# Patient Record
Sex: Female | Born: 1980 | Race: White | Hispanic: No | Smoking: Former smoker
Health system: Southern US, Community
[De-identification: ages and names within clinical notes are randomized; demographics above are authoritative.]

## PROBLEM LIST (undated history)

## (undated) DIAGNOSIS — R51 Headache: Secondary | ICD-10-CM

## (undated) DIAGNOSIS — F329 Major depressive disorder, single episode, unspecified: Secondary | ICD-10-CM

## (undated) DIAGNOSIS — F1910 Other psychoactive substance abuse, uncomplicated: Secondary | ICD-10-CM

## (undated) DIAGNOSIS — R519 Headache, unspecified: Secondary | ICD-10-CM

## (undated) HISTORY — DX: Major depressive disorder, single episode, unspecified: F32.9

## (undated) HISTORY — DX: Headache, unspecified: R51.9

## (undated) HISTORY — DX: Other psychoactive substance abuse, uncomplicated: F19.10

## (undated) HISTORY — DX: Headache: R51

---

## 2010-12-01 HISTORY — PX: VAGINAL HYSTERECTOMY: SUR661

## 2018-06-23 ENCOUNTER — Ambulatory Visit: Payer: Self-pay | Admitting: Nurse Practitioner

## 2018-06-23 ENCOUNTER — Encounter: Payer: Self-pay | Admitting: Nurse Practitioner

## 2018-06-23 ENCOUNTER — Other Ambulatory Visit: Payer: Self-pay

## 2018-06-23 VITALS — BP 107/58 | HR 76 | Temp 98.5°F | Ht 60.0 in | Wt 136.8 lb

## 2018-06-23 DIAGNOSIS — Z7689 Persons encountering health services in other specified circumstances: Secondary | ICD-10-CM

## 2018-06-23 DIAGNOSIS — K5901 Slow transit constipation: Secondary | ICD-10-CM

## 2018-06-23 DIAGNOSIS — F324 Major depressive disorder, single episode, in partial remission: Secondary | ICD-10-CM

## 2018-06-23 DIAGNOSIS — K922 Gastrointestinal hemorrhage, unspecified: Secondary | ICD-10-CM

## 2018-06-23 LAB — CBC WITH DIFFERENTIAL/PLATELET
Basophils Absolute: 67 cells/uL (ref 0–200)
Basophils Relative: 0.8 %
Eosinophils Absolute: 1571 cells/uL — ABNORMAL HIGH (ref 15–500)
Eosinophils Relative: 18.7 %
HCT: 37 % (ref 35.0–45.0)
Hemoglobin: 12.5 g/dL (ref 11.7–15.5)
Lymphs Abs: 2318 cells/uL (ref 850–3900)
MCH: 31 pg (ref 27.0–33.0)
MCHC: 33.8 g/dL (ref 32.0–36.0)
MCV: 91.8 fL (ref 80.0–100.0)
MPV: 10.9 fL (ref 7.5–12.5)
Monocytes Relative: 6.3 %
Neutro Abs: 3914 cells/uL (ref 1500–7800)
Neutrophils Relative %: 46.6 %
Platelets: 334 10*3/uL (ref 140–400)
RBC: 4.03 10*6/uL (ref 3.80–5.10)
RDW: 11.8 % (ref 11.0–15.0)
Total Lymphocyte: 27.6 %
WBC mixed population: 529 cells/uL (ref 200–950)
WBC: 8.4 10*3/uL (ref 3.8–10.8)

## 2018-06-23 MED ORDER — BUPROPION HCL ER (XL) 150 MG PO TB24: 150.0000 mg | ORAL_TABLET | Freq: Every day | ORAL | 6 refills | Status: DC

## 2018-06-23 MED ORDER — FLUOXETINE HCL 20 MG PO TABS: 60.0000 mg | ORAL_TABLET | Freq: Every day | ORAL | 1 refills | Status: DC

## 2018-06-23 NOTE — Patient Instructions (Addendum)
Cheryl Smith,   Thank you for coming in to clinic today.  1. Referral to GI - They will call you to schedule.  2. For depression -  Continue fluoxetine 60 mg once daily - CHANGE wellbutrin to Wellbutrin XL 150 mg tablet once daily.  Please schedule a follow-up appointment with Cheryl Smith, AGNP. Return in about 6 weeks (around 08/04/2018) for depression AND 3 months for annual physical.  If you have any other questions or concerns, please feel free to call the clinic or send a message through MyChart. You may also schedule an earlier appointment if necessary.  You will receive a survey after today's visit either digitally by e-mail or paper by [employer] mail. Your experiences and feedback matter to us.  Please respond so we know how we are doing as we provide care for you.   Cheryl McardleLauren Turrell Severt, DNP, AGNP-BC Adult Gerontology Nurse Practitioner [facility], [facility]

## 2018-06-23 NOTE — Progress Notes (Signed)
Subjective:    Patient ID: Cheryl Smith, female    DOB: 1981/02/18, 37 y.o.   MRN: [id number]  Cheryl Smith is a 38 y.o. female presenting on 06/23/2018 for Establish Care (irregular bowel movement. Constipation, w/ bloody stool x 1 yr )   HPI Establish Care New Provider Pt last seen by PCP Cheryl Bushy, NP [facility] several months years ago.  Obtain records from [facility].  Constipation Patient presents today with CC of constipation with bloody stool over last 1 year.   - She only has BM 1-2 x per week.  Usually is only 1 x per week as each BM requires use of suppository (fleet brand). - Is taking no medication other than suppositories for BM.  Has tried fiber in past, Miralax daily for 1 month which works initially, but notes she builds a tolerance to its effects. Dulcolax - makes her "sick."   - Monthly, pt takes mag citrate.    - Has had ribbon-like bowel movements in past. - Has bloody bowel movement once per week.  1 week ago, blood filled the toilet.  This only occurred once and has not happened since.  She regularly has blood within her stool that is dark red.  Blood is more than just streaks on outside of stool.  Depression Takes bupropion 100 mg SR once daily and fluoxetine 60 mg once daily.  She still has occasional depression, but feels like her medications are doing well.  In past, patient has been hospitalized for depression with substance abuse.  She has history of opioid abuse that began after receiving opioids for hysterectomy procedure pain. She admits continued opoid use helped her manage depression before she started prescription medications to control depression.  No current SI/HI or plans to carry out.  Depression screen PHQ 2/9 06/23/2018  Decreased Interest 1  Down, Depressed, Hopeless 1  PHQ - 2 Score 2  Altered sleeping 1  Tired, decreased energy 1  Change in appetite 0  Feeling bad or failure about yourself  0  Trouble concentrating 0    Moving slowly or fidgety/restless 0  Suicidal thoughts 0  PHQ-9 Score 4  Difficult doing work/chores Somewhat difficult     Past Medical History:  Diagnosis Date  . Depression   . Frequent headaches   . Headache    2x week tension, monthly migraine  . Substance abuse (HCC)    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   Past Surgical History:  Procedure Laterality Date  . VAGINAL HYSTERECTOMY Left 2012   Rt ovary remains   Social History   Socioeconomic History  . Marital status: Significant Other    Spouse name: Not on file  . Number of children: 1  . Years of education: Not on file  . Highest education level: Associate degree: occupational, Scientist, product/process development, or vocational program  Occupational History  . Not on file  Social Needs  . Financial resource strain: Not hard at all  . Food insecurity:    Worry: Never true    Inability: Never true  . Transportation needs:    Medical: No    Non-medical: No  Tobacco Use  . Smoking status: Current Every Day Smoker    Packs/day: 0.25    Years: 5.00    Pack years: 1.25  . Smokeless tobacco: Never Used  Substance and Sexual Activity  . Alcohol use: Not Currently  . Drug use: Not Currently  . Sexual activity: Yes    Birth control/protection: Surgical  Lifestyle  .  Physical activity:    Days per week: 3 days    Minutes per session: 60 min  . Stress: Not at all  Relationships  . Social connections:    Talks on phone: Not on file    Gets together: Not on file    Attends religious service: Not on file    Active member of club or organization: Not on file    Attends meetings of clubs or organizations: Not on file    Relationship status: Not on file  . Intimate partner violence:    Fear of current or ex partner: No    Emotionally abused: No    Physically abused: No    Forced sexual activity: No  Other Topics Concern  . Not on file  Social History Narrative  . Not on file   Family History  Problem Relation Age of Onset  . Depression  Mother   . Kidney cancer Maternal Grandmother   . Heart disease Maternal Grandmother   . Diabetes Maternal Grandfather   . Heart disease Paternal Grandmother   . Stroke Paternal Grandmother    No current outpatient medications on file prior to visit.   No current facility-administered medications on file prior to visit.     Review of Systems  Constitutional: Negative for chills and fever.  HENT: Negative for congestion and sore throat.   Eyes: Negative for pain.  Respiratory: Negative for cough, shortness of breath and wheezing.   Cardiovascular: Negative for chest pain, palpitations and leg swelling.  Gastrointestinal: Positive for abdominal distention, blood in stool and constipation. Negative for abdominal pain, diarrhea, nausea, rectal pain and vomiting.  Endocrine: Negative for polydipsia.  Genitourinary: Negative for dysuria, frequency, hematuria and urgency.  Musculoskeletal: Negative for back pain, myalgias and neck pain.  Skin: Negative.  Negative for rash.  Allergic/Immunologic: Negative for environmental allergies.  Neurological: Negative for dizziness, weakness and headaches.  Hematological: Does not bruise/bleed easily.  Psychiatric/Behavioral: Positive for dysphoric mood. Negative for sleep disturbance and suicidal ideas. The patient is not nervous/anxious.    Per HPI unless specifically indicated above.     Objective:    BP (!) 107/58 (BP Location: Right Arm, Patient Position: Sitting, Cuff Size: Normal)   Pulse 76   Temp 98.5 F (36.9 C) (Oral)   Ht 5' (1.524 m)   Wt 136 lb 12.8 oz (62.1 kg)   BMI 26.72 kg/m   Wt Readings from Last 3 Encounters:  06/23/18 136 lb 12.8 oz (62.1 kg)    Physical Exam  Constitutional: She is oriented to person, place, and time. She appears well-developed and well-nourished. No distress.  HENT:  Head: Normocephalic and atraumatic.  Cardiovascular: Normal rate, regular rhythm, S1 normal, S2 normal, normal heart sounds and  intact distal pulses.  Pulmonary/Chest: Effort normal and breath sounds normal. No respiratory distress.  Abdominal: Soft. Normal appearance. She exhibits mass (left middle abdomen - not a firm mass, feels like bowel fullness). She exhibits no distension. Bowel sounds are increased. There is no hepatosplenomegaly. There is generalized tenderness. There is no rigidity, no rebound, no guarding, no CVA tenderness, no tenderness at McBurney's point and negative Murphy's sign.  Neurological: She is alert and oriented to person, place, and time.  Skin: Skin is warm and dry. Capillary refill takes less than 2 seconds.  Psychiatric: She has a normal mood and affect. Her behavior is normal. Judgment and thought content normal.  Vitals reviewed.     Assessment & Plan:   Problem List  Items Addressed This Visit    None    Visit Diagnoses    Major depressive disorder with single episode, in partial remission (HCC)    -  Primary   Relevant Medications   FLUoxetine (PROZAC) 20 MG tablet   buPROPion (WELLBUTRIN XL) 150 MG 24 hr tablet   Encounter to establish care       Gastrointestinal hemorrhage, unspecified gastrointestinal hemorrhage type       Relevant Orders   Ambulatory referral to Gastroenterology   CBC with Differential/Platelet   Slow transit constipation       Relevant Orders   Ambulatory referral to Gastroenterology    #1 Establish Care [facility]  Records will be reviewed in CareEverywhere.  Past medical, family, and surgical history reviewed w/ patient in clinic.  #2 GI - Bleed and constipation    Chronic constipation with GI bleeding lasting > 1 year.  Patient with one episode BRBPR 1 week ago, no recurrences.  Constipation is chronic and likely slow motility as patient requires use of laxative glycerin suppository for every BM.  Cannot exclude colon cancer as cause for symptoms or GI bleed.  No prior colonoscopy. - Referral to GI evaluate constipation and GI  bleed.  Likely, will need colonoscopy.  Defer any additional imaging to GI. - Consider addition of Linzess or Amitiza after GI bleed cause is evaluated. - If any additional BRBPR, seek care in ED. - CBC today evaluate anemia. - Followup 6 weeks to 3 months.  #3 Depression - in partial remission with PHQ score 4 today on medications.  Patient currently well accommodated and is relatively satisfied with current symptom control level.  Denies SI/HI and has no plans to carry out if SI/HI arise.  - Change wellbutrin to WellbutrinXL 150 mg once daily.  Patient taking suboptimal dose of Wellbutrin SR once daily. - Encouraged patient to consider smoking cessation. May change dosing in future to support smoking cessation regimen if needed. - Continue fluoxetine 60 mg once daily. - Followup 6 weeks.   Meds ordered this encounter  Medications  . FLUoxetine (PROZAC) 20 MG tablet    Sig: Take 3 tablets (60 mg total) by mouth daily.    Dispense:  270 tablet    Refill:  1    Order Specific Question:   Supervising Provider    Answer:   Smitty CordsKARAMALEGOS, ALEXANDER J [[id number]]  . buPROPion (WELLBUTRIN XL) 150 MG 24 hr tablet    Sig: Take 1 tablet (150 mg total) by mouth daily.    Dispense:  30 tablet    Refill:  6    Convert to 90-day fill if patient requests for 2nd and later fills    Order Specific Question:   Supervising Provider    Answer:   Smitty CordsKARAMALEGOS, ALEXANDER J [[id number]]     Follow up plan: Return in about 6 weeks (around 08/04/2018) for depression AND 3 months for annual physical.  Wilhelmina McardleLauren Bentzion Dauria, DNP, AGPCNP-BC Adult Gerontology Primary Care Nurse Practitioner [facility] 06/23/2018, 5:09 PM

## 2018-07-01 ENCOUNTER — Ambulatory Visit: Payer: 59 | Admitting: Gastroenterology

## 2018-07-01 ENCOUNTER — Encounter: Payer: Self-pay | Admitting: Gastroenterology

## 2018-07-01 VITALS — BP 101/67 | HR 78 | Ht 60.0 in | Wt 133.6 lb

## 2018-07-01 DIAGNOSIS — K5900 Constipation, unspecified: Secondary | ICD-10-CM

## 2018-07-01 NOTE — Patient Instructions (Signed)
Miralax twice daily F/u 3 months  High-Fiber Diet Fiber, also called dietary fiber, is a type of carbohydrate found in fruits, vegetables, whole grains, and beans. A high-fiber diet can have many health benefits. Your health care provider may recommend a high-fiber diet to help:  Prevent constipation. Fiber can make your bowel movements more regular.  Lower your cholesterol.  Relieve hemorrhoids, uncomplicated diverticulosis, or irritable bowel syndrome.  Prevent overeating as part of a weight-loss plan.  Prevent heart disease, type 2 diabetes, and certain cancers.  What is my plan? The recommended daily intake of fiber includes:  38 grams for men under age 150.  30 grams for men over age 39.  25 grams for women under age 39.  21 grams for women over age 39.  You can get the recommended daily intake of dietary fiber by eating a variety of fruits, vegetables, grains, and beans. Your health care provider may also recommend a fiber supplement if it is not possible to get enough fiber through your diet. What do I need to know about a high-fiber diet?  Fiber supplements have not been widely studied for their effectiveness, so it is better to get fiber through food sources.  Always check the fiber content on thenutrition facts label of any prepackaged food. Look for foods that contain at least 5 grams of fiber per serving.  Ask your dietitian if you have questions about specific foods that are related to your condition, especially if those foods are not listed in the following section.  Increase your daily fiber consumption gradually. Increasing your intake of dietary fiber too quickly may cause bloating, cramping, or gas.  Drink plenty of water. Water helps you to digest fiber. What foods can I eat? Grains Whole-grain breads. Multigrain cereal. Oats and oatmeal. Brown rice. Barley. Bulgur wheat. Millet. Bran muffins. Popcorn. Rye wafer crackers. Vegetables Sweet potatoes.  Spinach. Kale. Artichokes. Cabbage. Broccoli. Green peas. Carrots. Squash. Fruits Berries. Pears. Apples. Oranges. Avocados. Prunes and raisins. Dried figs. Meats and Other Protein Sources Navy, kidney, pinto, and soy beans. Split peas. Lentils. Nuts and seeds. Dairy Fiber-fortified yogurt. Beverages Fiber-fortified soy milk. Fiber-fortified orange juice. Other Fiber bars. The items listed above may not be a complete list of recommended foods or beverages. Contact your dietitian for more options. What foods are not recommended? Grains White bread. Pasta made with refined flour. White rice. Vegetables Fried potatoes. Canned vegetables. Well-cooked vegetables. Fruits Fruit juice. Cooked, strained fruit. Meats and Other Protein Sources Fatty cuts of meat. Fried Environmental education officerpoultry or fried fish. Dairy Milk. Yogurt. Cream cheese. Sour cream. Beverages Soft drinks. Other Cakes and pastries. Butter and oils. The items listed above may not be a complete list of foods and beverages to avoid. Contact your dietitian for more information. What are some tips for including high-fiber foods in my diet?  Eat a wide variety of high-fiber foods.  Make sure that half of all grains consumed each day are whole grains.  Replace breads and cereals made from refined flour or white flour with whole-grain breads and cereals.  Replace white rice with brown rice, bulgur wheat, or millet.  Start the day with a breakfast that is high in fiber, such as a cereal that contains at least 5 grams of fiber per serving.  Use beans in place of meat in soups, salads, or pasta.  Eat high-fiber snacks, such as berries, raw vegetables, nuts, or popcorn. This information is not intended to replace advice given to you by your health  care provider. Make sure you discuss any questions you have with your health care provider. Document Released: 11/17/2005 Document Revised: 04/24/2016 Document Reviewed: 05/02/2014 Elsevier  Interactive Patient Education  [employer].

## 2018-07-01 NOTE — Progress Notes (Signed)
[name] [address]  [address]  [place], [place] [address]  Main: [phone]  Fax: [phone]   Gastroenterology Consultation  Referring Provider:     Galen Manila, * Primary Care Physician:  Galen Manila, NP Primary Gastroenterologist:  Dr. [name] Reason for Consultation:     Constipation        HPI:    Chief Complaint  Patient presents with  . New Patient (Initial Visit)    GI hemorrhage-something bulges from rectum, constipation    Cheryl Smith is a 37 y.o. y/o female referred for consultation & management  by Dr. Kyung Rudd, Alison Stalling, NP.  Patient reports 1 year history of constipation, and is unable to have bowel movements without the use of suppositories.  Uses a suppository at least 3 to 4 months times a month and is unable to go without them.  Has a bowel movement every 1 to 1/2 weeks.  Has used over-the-counter stool softeners before.  Also reports feeling a bulge intermittently when she strains that goes away after the bowel movement.  No blood per rectum.  No weight loss, abdominal pain, nausea or vomiting, heartburn or dysphasia.  No family history of colon cancer.  Tries to eat a high-fiber diet.  No prior colonoscopy or EGD.  Past Medical History:  Diagnosis Date  . Depression   . Frequent headaches   . Headache    2x week tension, monthly migraine  . Substance abuse (HCC)    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    Past Surgical History:  Procedure Laterality Date  . VAGINAL HYSTERECTOMY Left 2012   Rt ovary remains    Prior to Admission medications   Medication Sig Start Date End Date Taking? Authorizing Provider  buPROPion (WELLBUTRIN XL) 150 MG 24 hr tablet Take 1 tablet (150 mg total) by mouth daily. 06/23/18  Yes Galen Manila, NP  FLUoxetine (PROZAC) 20 MG tablet Take 3 tablets (60 mg total) by mouth daily. 06/23/18  Yes Galen Manila, NP    Family History  Problem Relation Age of Onset  .  Depression Mother   . Kidney cancer Maternal Grandmother   . Heart disease Maternal Grandmother   . Diabetes Maternal Grandfather   . Heart disease Paternal Grandmother   . Stroke Paternal Grandmother      Social History   Tobacco Use  . Smoking status: Current Every Day Smoker    Packs/day: 0.25    Years: 5.00    Pack years: 1.25  . Smokeless tobacco: Never Used  Substance Use Topics  . Alcohol use: Not Currently  . Drug use: Not Currently    Allergies as of 07/01/2018  . (No Known Allergies)    Review of Systems:    All systems reviewed and negative except where noted in HPI.   Physical Exam:  BP 101/67   Pulse 78   Ht 5' (1.524 m)   Wt 133 lb 10.1 oz (60.6 kg)   BMI 26.10 kg/m  No LMP recorded. Patient has had a hysterectomy. Psych:  Alert and cooperative. Normal mood and affect. General:   Alert,  Well-developed, well-nourished, pleasant and cooperative in NAD Head:  Normocephalic and atraumatic. Eyes:  Sclera clear, no icterus.   Conjunctiva pink. Ears:  Normal auditory acuity. Nose:  No deformity, discharge, or lesions. Mouth:  No deformity or lesions,oropharynx pink & moist. Neck:  Supple; no masses or thyromegaly. Lungs:  Respirations even and unlabored.  Clear throughout to  auscultation.   No wheezes, crackles, or rhonchi. No acute distress. Heart:  Regular rate and rhythm; no murmurs, clicks, rubs, or gallops. Abdomen:  Normal bowel sounds.  No bruits.  Soft, non-tender and non-distended without masses, hepatosplenomegaly or hernias noted.  No guarding or rebound tenderness.    Rectal: No external hemorrhoids, digital rectal exam did not reveal any masses in the rectal vault, brown stool.  on bearing down no prolapse or lesions were seen coming out of the rectum. Msk:  Symmetrical without gross deformities. Good, equal movement & strength bilaterally. Pulses:  Normal pulses noted. Extremities:  No clubbing or edema.  No cyanosis. Neurologic:  Alert and  oriented x3;  grossly normal neurologically. Skin:  Intact without significant lesions or rashes. No jaundice. Lymph Nodes:  No significant cervical adenopathy. Psych:  Alert and cooperative. Normal mood and affect.   Labs: CBC    Component Value Date/Time   WBC 8.4 06/23/2018 1105   RBC 4.03 06/23/2018 1105   HGB 12.5 06/23/2018 1105   HCT 37.0 06/23/2018 1105   PLT 334 06/23/2018 1105   MCV 91.8 06/23/2018 1105   MCH 31.0 06/23/2018 1105   MCHC 33.8 06/23/2018 1105   RDW 11.8 06/23/2018 1105   LYMPHSABS 2,318 06/23/2018 1105   EOSABS 1,571 (H) 06/23/2018 1105   BASOSABS 67 06/23/2018 1105   CMP  No results found for: NA, K, CL, CO2, GLUCOSE, BUN, CREATININE, CALCIUM, PROT, ALBUMIN, AST, ALT, ALKPHOS, BILITOT, GFRNONAA, GFRAA  Imaging Studies: No results found.  Assessment and Plan:   Carollee Herterllesha Coonrod is a 37 y.o. y/o female has been referred for constipation  Patient likely has internal hemorrhoids that bulge out with her straining and constipation Rectal prolapse another possibility, but was not present on physical exam today High-fiber diet MiraLAX  twice dailywith goal of soft bowel movements every 2 to 3 days if not every day.  If not at goal, patient instructed to increase dose to twice daily.  If loose stools with the medication, patient asked to decrease the medication to every other day, or half dose daily.  Patient verbalized understanding  I have asked patient to call other clinic and let us know how she is doing, as if MiraLAX does not work we will need to try other medications for her  No alarm symptoms present for indication for colonoscopy at this time, if symptoms do not improve can consider endoscopic evaluation at that time  Dr Melodie BouillonVarnita Myria Steenbergen

## 2018-07-02 ENCOUNTER — Telehealth: Payer: Self-pay

## 2018-07-02 NOTE — Telephone Encounter (Signed)
Pt calling regarding bulge from rectum when trying to have BM. It was not stool. Feels like a bad bruise, bubble (tennis ball). No itching or burning. This starts going back in on its own with a little help. She took a picture of this. Please advise.

## 2018-07-02 NOTE — Telephone Encounter (Signed)
Pt notified and will either bring picture to office or come by Monday 11:00 and let Dr. Maximino Greenlandahiliani look at it.

## 2018-07-15 ENCOUNTER — Other Ambulatory Visit: Payer: Self-pay | Admitting: Nurse Practitioner

## 2018-07-15 DIAGNOSIS — F324 Major depressive disorder, single episode, in partial remission: Secondary | ICD-10-CM

## 2018-08-05 ENCOUNTER — Encounter: Payer: Self-pay | Admitting: Nurse Practitioner

## 2018-08-05 ENCOUNTER — Ambulatory Visit (INDEPENDENT_AMBULATORY_CARE_PROVIDER_SITE_OTHER): Payer: 59 | Admitting: Nurse Practitioner

## 2018-08-05 ENCOUNTER — Other Ambulatory Visit: Payer: Self-pay

## 2018-08-05 DIAGNOSIS — F321 Major depressive disorder, single episode, moderate: Secondary | ICD-10-CM | POA: Diagnosis not present

## 2018-08-05 MED ORDER — BUPROPION HCL ER (XL) 300 MG PO TB24
300.0000 mg | ORAL_TABLET | Freq: Every day | ORAL | 2 refills | Status: DC
Start: 2018-08-05 — End: 2018-08-19

## 2018-08-05 NOTE — Patient Instructions (Addendum)
Cheryl Smith,   Thank you for coming in to clinic today.  1. PSYCH COUNSELING ONLY Self Referral: 1. [facility] [place] [facility]   Address: [address] [place], [place], [place] [address] Hours: Open today  9AM-7PM Phone: [phone]  2. Anell Barr [employer] Address: [address], [place], [place] [address] Phone: [phone]  - Schedule some time to color/do something artwork related and to read a book for fun.  2. Medications: Wellbutrin XL 300 mg once daily. (INCREASE from 150 mg daily) Fluoxetine 60 mg once daily. (Continue without change)  Please schedule a follow-up appointment with Wilhelmina Mcardle, AGNP. Return in about 2 months (around 10/05/2018) for depression.  If you have any other questions or concerns, please feel free to call the clinic or send a message through MyChart. You may also schedule an earlier appointment if necessary.  You will receive a survey after today's visit either digitally by e-mail or paper by [employer]. Your experiences and feedback matter to [place].  Please respond so we know how we are doing as we provide care for you.   Wilhelmina Mcardle, DNP, AGNP-BC Adult Gerontology Nurse Practitioner [facility], [facility]

## 2018-08-05 NOTE — Assessment & Plan Note (Signed)
Worsening depression per patient report and by PHQ9 scores.  Continue fluoxetine 60 mg once daily.  INCREASE wellbutrin XL to 300 mg once daily. Encouraged patient to consider counseling.  References provided.  Find 1-2 things to do in next month and schedule them for finding things that can bring purpose/meaning/stress relief/personal identity.  Followup 2 months.

## 2018-08-05 NOTE — Progress Notes (Signed)
Subjective:    Patient ID: Cheryl Smith, female    DOB: [birth year], 37 y.o.   MRN: [id number]  Cheryl Smith is a 37 y.o. female presenting on 08/05/2018 for Depression   HPI Depression Wellbutrin increased to 150 mg once daily at last visit.  Patient continues to report low activation.  States "I don't even know who I am without her anymore."    Patient is having difficulty with her grown daughter moving away.  Has identity loss as primary trigger for depression/down moods.   Patient also cites fear of relapse into addiction as another cause for down moods.  Continues to rely on social support system with boyfriend and parents to help when fearful of returning to addiction.  Patient states last few weeks have been more difficult.  Depression screen [facility] 2/9 08/05/2018 06/23/2018  Decreased Interest 1 1  Down, Depressed, Hopeless 1 1  PHQ - 2 Score 2 2  Altered sleeping 2 1  Tired, decreased energy 2 1  Change in appetite 1 0  Feeling bad or failure about yourself  2 0  Trouble concentrating 2 0  Moving slowly or fidgety/restless 0 0  Suicidal thoughts 0 0  PHQ-9 Score 11 4  Difficult doing work/chores Somewhat difficult Somewhat difficult    Social History   Tobacco Use  . Smoking status: Current Every Day Smoker    Packs/day: 0.25    Years: 5.00    Pack years: 1.25  . Smokeless tobacco: Never Used  Substance Use Topics  . Alcohol use: Not Currently  . Drug use: Not Currently    Review of Systems Per HPI unless specifically indicated above     Objective:    BP 109/67 (BP Location: Right Arm, Patient Position: Sitting, Cuff Size: Small)   Ht 5' (1.524 m)   Wt 133 lb 3.2 oz (60.4 kg)   BMI 26.01 kg/m   Wt Readings from Last 3 Encounters:  08/05/18 133 lb 3.2 oz (60.4 kg)  07/01/18 133 lb 10.1 oz (60.6 kg)  06/23/18 136 lb 12.8 oz (62.1 kg)    Physical Exam  Constitutional: She is oriented to person, place, and time. She appears well-developed and well-nourished.  No distress.  HENT:  Head: Normocephalic and atraumatic.  Cardiovascular: Normal rate, regular rhythm, S1 normal, S2 normal, normal heart sounds and intact distal pulses.  Pulmonary/Chest: Effort normal and breath sounds normal. No respiratory distress.  Neurological: She is alert and oriented to person, place, and time.  Skin: Skin is warm and dry. Capillary refill takes less than 2 seconds.  Psychiatric: Her speech is normal and behavior is normal. Judgment and thought content normal. Her mood appears anxious. Cognition and memory are normal. She exhibits a depressed mood. She expresses no homicidal and no suicidal ideation. She expresses no suicidal plans and no homicidal plans.  Vitals reviewed.  Results for orders placed or performed in visit on 06/23/18  CBC with Differential/Platelet  Result Value Ref Range   WBC 8.4 3.8 - 10.8 Thousand/uL   RBC 4.03 3.80 - 5.10 Million/uL   Hemoglobin 12.5 11.7 - 15.5 g/dL   HCT 41.5 83.0 - 94.0 %   MCV 91.8 80.0 - 100.0 fL   MCH 31.0 27.0 - 33.0 pg   MCHC 33.8 32.0 - 36.0 g/dL   RDW 76.8 08.8 - 11.0 %   Platelets 334 140 - 400 Thousand/uL   MPV 10.9 7.5 - 12.5 fL   Neutro Abs 3,914 1,500 - 7,800 cells/uL  Lymphs Abs 2,318 850 - 3,900 cells/uL   WBC mixed population 529 200 - 950 cells/uL   Eosinophils Absolute 1,571 (H) 15 - 500 cells/uL   Basophils Absolute 67 0 - 200 cells/uL   Neutrophils Relative % 46.6 %   Total Lymphocyte 27.6 %   Monocytes Relative 6.3 %   Eosinophils Relative 18.7 %   Basophils Relative 0.8 %      Assessment & Plan:   Problem List Items Addressed This Visit      Other   Current moderate episode of major depressive disorder without prior episode (HCC)    Worsening depression per patient report and by PHQ9 scores.  Continue fluoxetine 60 mg once daily.  INCREASE wellbutrin XL to 300 mg once daily. Encouraged patient to consider counseling.  References provided.  Find 1-2 things to do in next month and schedule  them for finding things that can bring purpose/meaning/stress relief/personal identity.  Followup 2 months.      Relevant Medications   buPROPion (WELLBUTRIN XL) 300 MG 24 hr tablet       Meds ordered this encounter  Medications  . buPROPion (WELLBUTRIN XL) 300 MG 24 hr tablet    Sig: Take 1 tablet (300 mg total) by mouth daily.    Dispense:  30 tablet    Refill:  2    Order Specific Question:   Supervising Provider    Answer:   Smitty Cords [[id number]]    Follow up plan: Return in about 2 months (around 10/05/2018) for depression.  A total of 30 minutes was spent face-to-face with this patient. Greater than 50% of this time (20/30 minutes) was spent in counseling and coordination of care with the patient for identifying sources of depression, possible ways out, counseling, medication adjustment, non-pharm approaches to management.   Wilhelmina Mcardle, DNP, AGPCNP-BC Adult Gerontology Primary Care Nurse Practitioner [facility] 08/05/2018, 5:59 PM

## 2018-08-19 ENCOUNTER — Other Ambulatory Visit: Payer: Self-pay | Admitting: Nurse Practitioner

## 2018-08-19 DIAGNOSIS — F321 Major depressive disorder, single episode, moderate: Secondary | ICD-10-CM

## 2018-08-20 MED ORDER — BUPROPION HCL ER (XL) 300 MG PO TB24: 300.0000 mg | ORAL_TABLET | Freq: Every day | ORAL | 0 refills | Status: DC

## 2018-08-20 NOTE — Addendum Note (Signed)
Addended by: Wilhelmina McardleKENNEDY, Alexandera Kuntzman R on: 08/20/2018 12:14 PM   Modules accepted: Orders

## 2018-09-24 ENCOUNTER — Ambulatory Visit (INDEPENDENT_AMBULATORY_CARE_PROVIDER_SITE_OTHER): Payer: 59 | Admitting: Nurse Practitioner

## 2018-09-24 ENCOUNTER — Encounter: Payer: Self-pay | Admitting: Nurse Practitioner

## 2018-09-24 ENCOUNTER — Other Ambulatory Visit: Payer: Self-pay

## 2018-09-24 VITALS — BP 104/57 | HR 79 | Temp 98.4°F | Ht 60.0 in | Wt 135.8 lb

## 2018-09-24 DIAGNOSIS — Z131 Encounter for screening for diabetes mellitus: Secondary | ICD-10-CM

## 2018-09-24 DIAGNOSIS — Z23 Encounter for immunization: Secondary | ICD-10-CM

## 2018-09-24 DIAGNOSIS — Z0000 Encounter for general adult medical examination without abnormal findings: Secondary | ICD-10-CM | POA: Diagnosis not present

## 2018-09-24 NOTE — Progress Notes (Signed)
Subjective:    Patient ID: Cheryl Smith, female    DOB: 01/27/1981, 37 y.o.   MRN: [id number]  Cheryl Smith is a 37 y.o. female presenting on 09/24/2018 for Annual Exam   HPI Annual Physical Exam Patient has been feeling well.  They have no acute concerns today. Sleeps 5-6 hours per night interrupted, easily able to return to sleep within 30-60 minutes.  Improving with essential oils.  Wakes spontaneously and "ready to go."    HEALTH MAINTENANCE: Weight/BMI: stable Physical activity: no regular exercise Diet: generally healthy, few fried foods/meals out Seatbelt: always except in back seat Sunscreen: regularly PAP: Hysterectomy 2012, L oophrectomy HIV: neg at last check, no new partners  Optometry: not regularly Dentistry: regular  VACCINES: Tetanus: does not recall Influenza: requests  Past Medical History:  Diagnosis Date  . Depression   . Frequent headaches   . Headache    2x week tension, monthly migraine  . Substance abuse (HCC)    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   Past Surgical History:  Procedure Laterality Date  . VAGINAL HYSTERECTOMY Left 2012   Rt ovary remains   Social History   Socioeconomic History  . Marital status: Significant Other    Spouse name: Not on file  . Number of children: 1  . Years of education: Not on file  . Highest education level: Associate degree: occupational, Scientist, product/process development, or vocational program  Occupational History  . Not on file  Social Needs  . Financial resource strain: Not hard at all  . Food insecurity:    Worry: Never true    Inability: Never true  . Transportation needs:    Medical: No    Non-medical: No  Tobacco Use  . Smoking status: Current Every Day Smoker    Packs/day: 0.25    Years: 5.00    Pack years: 1.25  . Smokeless tobacco: Never Used  Substance and Sexual Activity  . Alcohol use: Not Currently  . Drug use: Not Currently  . Sexual activity: Yes    Birth control/protection: Surgical  Lifestyle  .  Physical activity:    Days per week: 3 days    Minutes per session: 60 min  . Stress: Not at all  Relationships  . Social connections:    Talks on phone: Not on file    Gets together: Not on file    Attends religious service: Not on file    Active member of club or organization: Not on file    Attends meetings of clubs or organizations: Not on file    Relationship status: Not on file  . Intimate partner violence:    Fear of current or ex partner: No    Emotionally abused: No    Physically abused: No    Forced sexual activity: No  Other Topics Concern  . Not on file  Social History Narrative  . Not on file   Family History  Problem Relation Age of Onset  . Depression Mother   . Kidney cancer Maternal Grandmother   . Heart disease Maternal Grandmother   . Diabetes Maternal Grandfather   . Heart disease Paternal Grandmother   . Stroke Paternal Grandmother    Current Outpatient Medications on File Prior to Visit  Medication Sig  . buPROPion (WELLBUTRIN XL) 300 MG 24 hr tablet Take 1 tablet (300 mg total) by mouth daily.  Marland Kitchen FLUoxetine (PROZAC) 20 MG tablet Take 3 tablets (60 mg total) by mouth daily.   No current facility-administered medications on  file prior to visit.     Review of Systems  Constitutional: Negative for chills and fever.  HENT: Negative for congestion and sore throat.   Eyes: Negative for pain.  Respiratory: Negative for cough, shortness of breath and wheezing.   Cardiovascular: Negative for chest pain, palpitations and leg swelling.  Gastrointestinal: Negative for abdominal pain, blood in stool, constipation, diarrhea, nausea and vomiting.  Endocrine: Negative for polydipsia.  Genitourinary: Negative for dysuria, frequency, hematuria and urgency.  Musculoskeletal: Negative for back pain, myalgias and neck pain.  Skin: Negative.  Negative for rash.  Allergic/Immunologic: Negative for environmental allergies.  Neurological: Positive for headaches  (improving off OTC meds regularly). Negative for dizziness and weakness.  Hematological: Does not bruise/bleed easily.  Psychiatric/Behavioral: Positive for sleep disturbance. Negative for dysphoric mood and suicidal ideas. The patient is nervous/anxious (stable, chronic).    Per HPI unless specifically indicated above     Objective:    BP (!) 104/57   Pulse 79   Temp 98.4 F (36.9 C) (Oral)   Ht 5' (1.524 m)   Wt 135 lb 12.8 oz (61.6 kg)   BMI 26.52 kg/m   Wt Readings from Last 3 Encounters:  09/24/18 135 lb 12.8 oz (61.6 kg)  08/05/18 133 lb 3.2 oz (60.4 kg)  07/01/18 133 lb 10.1 oz (60.6 kg)    Physical Exam  Constitutional: She is oriented to person, place, and time. She appears well-developed and well-nourished. No distress.  HENT:  Head: Normocephalic and atraumatic.  Right Ear: External ear normal.  Left Ear: External ear normal.  Nose: Nose normal.  Mouth/Throat: Oropharynx is clear and moist.  Eyes: Pupils are equal, round, and reactive to light. Conjunctivae are normal.  Neck: Normal range of motion. Neck supple. No JVD present. No tracheal deviation present. No thyromegaly present.  Cardiovascular: Normal rate, regular rhythm, normal heart sounds and intact distal pulses. Exam reveals no gallop and no friction rub.  No murmur heard. Pulmonary/Chest: Effort normal and breath sounds normal. No respiratory distress.  Abdominal: Soft. Bowel sounds are normal. She exhibits no distension. There is no hepatosplenomegaly. There is no tenderness.  Genitourinary:  Genitourinary Comments: Exam deferred by patient. Breast - Normal exam w/ symmetric breasts, no mass, no nipple discharge, no skin changes or tenderness.  Musculoskeletal: Normal range of motion.  Lymphadenopathy:    She has no cervical adenopathy.  Neurological: She is alert and oriented to person, place, and time. No cranial nerve deficit.  Skin: Skin is warm and dry. Capillary refill takes less than 2 seconds.   Psychiatric: She has a normal mood and affect. Her behavior is normal. Judgment and thought content normal.  Nursing note and vitals reviewed.  Results for orders placed or performed in visit on 06/23/18  CBC with Differential/Platelet  Result Value Ref Range   WBC 8.4 3.8 - 10.8 Thousand/uL   RBC 4.03 3.80 - 5.10 Million/uL   Hemoglobin 12.5 11.7 - 15.5 g/dL   HCT 16.1 09.6 - 04.5 %   MCV 91.8 80.0 - 100.0 fL   MCH 31.0 27.0 - 33.0 pg   MCHC 33.8 32.0 - 36.0 g/dL   RDW 40.9 81.1 - 91.4 %   Platelets 334 140 - 400 Thousand/uL   MPV 10.9 7.5 - 12.5 fL   Neutro Abs 3,914 1,500 - 7,800 cells/uL   Lymphs Abs 2,318 850 - 3,900 cells/uL   WBC mixed population 529 200 - 950 cells/uL   Eosinophils Absolute 1,571 (H) 15 - 500  cells/uL   Basophils Absolute 67 0 - 200 cells/uL   Neutrophils Relative % 46.6 %   Total Lymphocyte 27.6 %   Monocytes Relative 6.3 %   Eosinophils Relative 18.7 %   Basophils Relative 0.8 %      Assessment & Plan:   Problem List Items Addressed This Visit    None    Visit Diagnoses    Encounter for annual physical exam    -  Primary   Relevant Orders   TSH   Lipid panel   COMPLETE METABOLIC PANEL WITH GFR   CBC with Differential/Platelet   Hemoglobin A1c   Screening for diabetes mellitus       Relevant Orders   Hemoglobin A1c   Needs flu shot       Relevant Orders   Flu Vaccine QUAD 6+ mos PF IM (Fluarix Quad PF) (Completed)   Need for Tdap vaccination       Relevant Orders   Tdap vaccine greater than or equal to 7yo IM (Completed)      Physical exam with no new findings.  Well adult with no acute concerns today.  Sleep disruption present on screening questions.  Possibly related to anxiety and depression.   Plan: 1. Obtain health maintenance screenings as above according to age. - Increase physical activity to 30 minutes most days of the week.  - Eat healthy diet high in vegetables and fruits; low in refined carbohydrates. - Encouraged good  sleep hygiene - handout provided.  Follow up with this at next anxiety and depression visit. 2. Return 1 year for annual physical.   Follow up plan: Return in about 1 year (around 09/25/2019) for annual physical AND 1 month for anxiety and depression.  Wilhelmina Mcardle, DNP, AGPCNP-BC Adult Gerontology Primary Care Nurse Practitioner [facility] 09/24/2018, 3:36 PM

## 2018-09-24 NOTE — Patient Instructions (Addendum)
Cheryl Smith,   Thank you for coming in to clinic today.  1. Increase your physical activity until you are increasing your heart rate for 30 minutes on most days of the week.  2. Sleep Hygiene Tips  Take medicines only as directed by your health care provider.  Keep regular sleeping and waking hours. Avoid naps.  Keep a sleep diary to help you and your health care provider figure out what could be causing your insomnia. Include:  When you sleep.  When you wake up during the night.  How well you sleep.  How rested you feel the next day.  Any side effects of medicines you are taking.  What you eat and drink.  Make your bedroom a comfortable place where it is easy to fall asleep:  Put up shades or special blackout curtains to block light from outside.  Use a white noise machine to block noise.  Keep the temperature cool.  Exercise regularly as directed by your health care provider. Avoid exercising right before bedtime.  Use relaxation techniques to manage stress. Ask your health care provider to suggest some techniques that may work well for you. These may include:  Breathing exercises.  Routines to release muscle tension.  Visualizing peaceful scenes.  Cut back on alcohol, caffeinated beverages, and cigarettes, especially close to bedtime. These can disrupt your sleep.  Do not overeat or eat spicy foods right before bedtime. This can lead to digestive discomfort that can make it hard for you to sleep.  Limit screen use before bedtime. This includes:  Watching TV.  Using your smartphone, tablet, and computer.  Stick to a routine. This can help you fall asleep faster. Try to do a quiet activity, brush your teeth, and go to bed at the same time each night.  Get out of bed if you are still awake after 15 minutes of trying to sleep. Keep the lights down, but try reading or doing a quiet activity. When you feel sleepy, go back to bed.  Make sure that you drive  carefully. Avoid driving if you feel very sleepy.  Keep all follow-up appointments as directed by your health care provider. This is important.    Please schedule a follow-up appointment with Wilhelmina Mcardle, AGNP. Return in about 1 year (around 09/25/2019) for annual physical AND as scheduled for anxiety and depression.  If you have any other questions or concerns, please feel free to call the clinic or send a message through MyChart. You may also schedule an earlier appointment if necessary.  You will receive a survey after today's visit either digitally by e-mail or paper by [employer]. Your experiences and feedback matter to [place].  Please respond so we know how we are doing as we provide care for you.   Wilhelmina Mcardle, DNP, AGNP-BC Adult Gerontology Nurse Practitioner [facility], [facility]

## 2018-09-28 ENCOUNTER — Other Ambulatory Visit: Payer: 59

## 2018-09-29 LAB — COMPLETE METABOLIC PANEL WITH GFR
AG Ratio: 1.7 (calc) (ref 1.0–2.5)
ALT: 13 U/L (ref 6–29)
AST: 16 U/L (ref 10–30)
Albumin: 4 g/dL (ref 3.6–5.1)
Alkaline phosphatase (APISO): 73 U/L (ref 33–115)
BUN: 14 mg/dL (ref 7–25)
CO2: 27 mmol/L (ref 20–32)
Calcium: 9.1 mg/dL (ref 8.6–10.2)
Chloride: 105 mmol/L (ref 98–110)
Creat: 1.02 mg/dL (ref 0.50–1.10)
GFR, Est African American: 80 mL/min/{1.73_m2} (ref >=60)
GFR, Est Non African American: 69 mL/min/{1.73_m2} (ref >=60)
Globulin: 2.4 g/dL (calc) (ref 1.9–3.7)
Glucose, Bld: 102 mg/dL — ABNORMAL HIGH (ref 65–99)
Potassium: 4.8 mmol/L (ref 3.5–5.3)
Sodium: 141 mmol/L (ref 135–146)
Total Bilirubin: 0.3 mg/dL (ref 0.2–1.2)
Total Protein: 6.4 g/dL (ref 6.1–8.1)

## 2018-09-29 LAB — CBC WITH DIFFERENTIAL/PLATELET
Basophils Absolute: 41 cells/uL (ref 0–200)
Basophils Relative: 0.9 %
Eosinophils Absolute: 409 cells/uL (ref 15–500)
Eosinophils Relative: 8.9 %
HCT: 37 % (ref 35.0–45.0)
Hemoglobin: 12.8 g/dL (ref 11.7–15.5)
Lymphs Abs: 1776 cells/uL (ref 850–3900)
MCH: 31.1 pg (ref 27.0–33.0)
MCHC: 34.6 g/dL (ref 32.0–36.0)
MCV: 89.8 fL (ref 80.0–100.0)
MPV: 10.2 fL (ref 7.5–12.5)
Monocytes Relative: 11.8 %
Neutro Abs: 1831 cells/uL (ref 1500–7800)
Neutrophils Relative %: 39.8 %
Platelets: 347 10*3/uL (ref 140–400)
RBC: 4.12 10*6/uL (ref 3.80–5.10)
RDW: 12.5 % (ref 11.0–15.0)
Total Lymphocyte: 38.6 %
WBC mixed population: 543 cells/uL (ref 200–950)
WBC: 4.6 10*3/uL (ref 3.8–10.8)

## 2018-09-29 LAB — HEMOGLOBIN A1C
Hgb A1c MFr Bld: 5.4 % of total Hgb (ref <5.7)
Mean Plasma Glucose: 108 (calc)
eAG (mmol/L): 6 (calc)

## 2018-09-29 LAB — LIPID PANEL
Cholesterol: 283 mg/dL — ABNORMAL HIGH (ref <200)
HDL: 84 mg/dL (ref >50)
LDL Cholesterol (Calc): 176 mg/dL (calc) — ABNORMAL HIGH
Non-HDL Cholesterol (Calc): 199 mg/dL (calc) — ABNORMAL HIGH (ref <130)
Total CHOL/HDL Ratio: 3.4 (calc) (ref <5.0)
Triglycerides: 105 mg/dL (ref <150)

## 2018-09-29 LAB — TSH: TSH: 2.26 mIU/L

## 2018-10-05 ENCOUNTER — Encounter: Payer: Self-pay | Admitting: *Deleted

## 2018-10-05 ENCOUNTER — Ambulatory Visit: Payer: 59 | Admitting: Nurse Practitioner

## 2018-10-05 ENCOUNTER — Ambulatory Visit: Payer: 59 | Admitting: Gastroenterology

## 2018-10-05 DIAGNOSIS — K5900 Constipation, unspecified: Secondary | ICD-10-CM

## 2018-10-18 ENCOUNTER — Other Ambulatory Visit: Payer: Self-pay | Admitting: Nurse Practitioner

## 2018-10-18 DIAGNOSIS — F324 Major depressive disorder, single episode, in partial remission: Secondary | ICD-10-CM

## 2018-11-09 ENCOUNTER — Ambulatory Visit: Payer: 59 | Admitting: Nurse Practitioner

## 2018-11-16 ENCOUNTER — Other Ambulatory Visit: Payer: Self-pay | Admitting: Nurse Practitioner

## 2018-11-16 DIAGNOSIS — F321 Major depressive disorder, single episode, moderate: Secondary | ICD-10-CM

## 2019-01-07 ENCOUNTER — Other Ambulatory Visit: Payer: Self-pay | Admitting: Nurse Practitioner

## 2019-01-07 DIAGNOSIS — F324 Major depressive disorder, single episode, in partial remission: Secondary | ICD-10-CM

## 2019-02-09 ENCOUNTER — Other Ambulatory Visit: Payer: Self-pay | Admitting: Nurse Practitioner

## 2019-02-09 DIAGNOSIS — F324 Major depressive disorder, single episode, in partial remission: Secondary | ICD-10-CM

## 2019-02-10 NOTE — Telephone Encounter (Signed)
Pharmacy requesting refills. Thanks!  

## 2019-03-13 ENCOUNTER — Other Ambulatory Visit: Payer: Self-pay | Admitting: Nurse Practitioner

## 2019-03-13 DIAGNOSIS — F324 Major depressive disorder, single episode, in partial remission: Secondary | ICD-10-CM

## 2019-03-13 DIAGNOSIS — F321 Major depressive disorder, single episode, moderate: Secondary | ICD-10-CM

## 2019-03-28 ENCOUNTER — Other Ambulatory Visit: Payer: Self-pay

## 2019-03-28 ENCOUNTER — Encounter: Payer: Self-pay | Admitting: Family Medicine

## 2019-03-28 ENCOUNTER — Ambulatory Visit (INDEPENDENT_AMBULATORY_CARE_PROVIDER_SITE_OTHER): Payer: 59 | Admitting: Family Medicine

## 2019-03-28 DIAGNOSIS — F418 Other specified anxiety disorders: Secondary | ICD-10-CM | POA: Diagnosis not present

## 2019-03-28 DIAGNOSIS — F324 Major depressive disorder, single episode, in partial remission: Secondary | ICD-10-CM

## 2019-03-28 DIAGNOSIS — F331 Major depressive disorder, recurrent, moderate: Secondary | ICD-10-CM | POA: Insufficient documentation

## 2019-03-28 DIAGNOSIS — F3341 Major depressive disorder, recurrent, in partial remission: Secondary | ICD-10-CM | POA: Insufficient documentation

## 2019-03-28 DIAGNOSIS — F411 Generalized anxiety disorder: Secondary | ICD-10-CM | POA: Insufficient documentation

## 2019-03-28 MED ORDER — FLUOXETINE HCL 20 MG PO CAPS: 60.0000 mg | ORAL_CAPSULE | Freq: Every day | ORAL | 1 refills | Status: DC

## 2019-03-28 MED ORDER — BUPROPION HCL ER (XL) 300 MG PO TB24: 300.0000 mg | ORAL_TABLET | Freq: Every day | ORAL | 1 refills | Status: DC

## 2019-03-28 NOTE — Patient Instructions (Addendum)
AVS info given to patient over phone. No Mychart.

## 2019-03-28 NOTE — Progress Notes (Signed)
Subjective:    Patient ID: Cheryl HerterAllesha Smith, female    DOB: 10-24-1981, 38 y.o.   MRN: [id number]  Cheryl Herterllesha Donate is a 38 y.o. female presenting on 03/28/2019 for Anxiety and Depression  Virtual / Telehealth Encounter - Video Visit via [email or web address] The purpose of this virtual visit is to provide medical care while limiting exposure to the novel coronavirus (COVID19) for both patient and office staff.  Consent was obtained for remote visit:  Yes.   Answered questions that patient had about telehealth interaction:  Yes.   I discussed the limitations, risks, security and privacy concerns of performing an evaluation and management service by video/telephone. I also discussed with the patient that there may be a patient responsible charge related to this service. The patient expressed understanding and agreed to proceed.  Patient Location: [facility]  PCP is Wilhelmina McardleLauren Kennedy, AGPCNP-BC - I am currently covering during her maternity leave.   HPI   Chief Complaint  Patient presents with  . Anxiety  . Depression   Major Depression, single episode - in partial remission / Anxiety - Last visit with PCP Wilhelmina McardleLauren Kennedy, AGPCNP-BC for these issues was 08/2018 approx >6 months ago, with worsening depression, she had wellbutrin XL increased from 150 to 300mg , and also was already on fluoxetine 60mg  daily, see prior notes for background information. - Interval update with significant improvement on current mediciations - Today patient reports no new concerns for me. She feels like both her mood and anxiety have been much better controlled lately - No new questions today - She request refill of both meds, 90 day supply - Denies any worse mood, depression, anxiety, panic   [facility] 03/28/2019 09/24/2018 08/05/2018  Decreased Interest 0 0 1  Down, Depressed, Hopeless 0 0 1  PHQ - 2 Score 0 0 2  Altered sleeping 1 1 2   Tired, decreased energy  1 1 2   Change in appetite 0 0 1  Feeling bad or failure about yourself  0 0 2  Trouble concentrating 0 1 2  Moving slowly or fidgety/restless 0 0 0  Suicidal thoughts 0 0 0  PHQ-9 Score 2 3 11   Difficult doing work/chores Not difficult at all Not difficult at all Somewhat difficult   GAD 7 : Generalized Anxiety Score 03/28/2019 09/24/2018 08/05/2018  Nervous, Anxious, on Edge 1 1 1   Control/stop worrying 1 2 2   Worry too much - different things 2 2 2   Trouble relaxing 1 2 1   Restless 0 2 1  Easily annoyed or irritable 0 1 1  Afraid - awful might happen 0 2 0  Total GAD 7 Score 5 12 8   Anxiety Difficulty Not difficult at all Not difficult at all Somewhat difficult    Social History   Tobacco Use  . Smoking status: Former Smoker    Packs/day: 0.25    Years: 5.00    Pack years: 1.25    Last attempt to quit: 12/01/2018    Years since quitting: 0.3  . Smokeless tobacco: Never Used  Substance Use Topics  . Alcohol use: Not Currently  . Drug use: Not Currently    Review of Systems Per HPI unless specifically indicated above     Objective:    There were no vitals taken for this visit.  Wt Readings from Last 3 Encounters:  09/24/18 135 lb 12.8 oz (61.6 kg)  08/05/18 133 lb 3.2 oz (60.4 kg)  07/01/18 133 lb  10.1 oz (60.6 kg)    Physical Exam   Note examination was completely remotely via video observation objective data only  Gen - well-appearing, no acute distress or apparent pain, comfortable HEENT - eyes appear clear without discharge or redness Heart/Lungs - cannot examine virtually - observed no evidence of coughing or labored breathing. Skin - face visible today- no rash Neuro - awake, alert, oriented Psych - not anxious appearing, mood is appropriate, not depressed, normal thoughts and speech, good insight into mental health   Results for orders placed or performed in visit on 09/24/18  TSH  Result Value Ref Range   TSH 2.26 mIU/L  Lipid panel  Result Value  Ref Range   Cholesterol 283 (H) <200 mg/dL   HDL 84 >67 mg/dL   Triglycerides 014 <103 mg/dL   LDL Cholesterol (Calc) 176 (H) mg/dL (calc)   Total CHOL/HDL Ratio 3.4 <5.0 (calc)   Non-HDL Cholesterol (Calc) 199 (H) <130 mg/dL (calc)  COMPLETE METABOLIC PANEL WITH GFR  Result Value Ref Range   Glucose, Bld 102 (H) 65 - 99 mg/dL   BUN 14 7 - 25 mg/dL   Creat 0.13 1.43 - 8.88 mg/dL   GFR, Est Non African American 69 > OR = 60 mL/min/1.72m2   GFR, Est African American 80 > OR = 60 mL/min/1.23m2   BUN/Creatinine Ratio NOT APPLICABLE 6 - 22 (calc)   Sodium 141 135 - 146 mmol/L   Potassium 4.8 3.5 - 5.3 mmol/L   Chloride 105 98 - 110 mmol/L   CO2 27 20 - 32 mmol/L   Calcium 9.1 8.6 - 10.2 mg/dL   Total Protein 6.4 6.1 - 8.1 g/dL   Albumin 4.0 3.6 - 5.1 g/dL   Globulin 2.4 1.9 - 3.7 g/dL (calc)   AG Ratio 1.7 1.0 - 2.5 (calc)   Total Bilirubin 0.3 0.2 - 1.2 mg/dL   Alkaline phosphatase (APISO) 73 33 - 115 U/L   AST 16 10 - 30 U/L   ALT 13 6 - 29 U/L  CBC with Differential/Platelet  Result Value Ref Range   WBC 4.6 3.8 - 10.8 Thousand/uL   RBC 4.12 3.80 - 5.10 Million/uL   Hemoglobin 12.8 11.7 - 15.5 g/dL   HCT 75.7 97.2 - 82.0 %   MCV 89.8 80.0 - 100.0 fL   MCH 31.1 27.0 - 33.0 pg   MCHC 34.6 32.0 - 36.0 g/dL   RDW 60.1 56.1 - 53.7 %   Platelets 347 140 - 400 Thousand/uL   MPV 10.2 7.5 - 12.5 fL   Neutro Abs 1,831 1,500 - 7,800 cells/uL   Lymphs Abs 1,776 850 - 3,900 cells/uL   WBC mixed population 543 200 - 950 cells/uL   Eosinophils Absolute 409 15 - 500 cells/uL   Basophils Absolute 41 0 - 200 cells/uL   Neutrophils Relative % 39.8 %   Total Lymphocyte 38.6 %   Monocytes Relative 11.8 %   Eosinophils Relative 8.9 %   Basophils Relative 0.9 %  Hemoglobin A1c  Result Value Ref Range   Hgb A1c MFr Bld 5.4 <5.7 % of total Hgb   Mean Plasma Glucose 108 (calc)   eAG (mmol/L) 6.0 (calc)      Assessment & Plan:   Problem List Items Addressed This Visit    Anxiety  associated with depression   Relevant Medications   buPROPion (WELLBUTRIN XL) 300 MG 24 hr tablet   FLUoxetine (PROZAC) 20 MG capsule   Major depressive disorder with single episode,  in partial remission (HCC) - Primary   Relevant Medications   buPROPion (WELLBUTRIN XL) 300 MG 24 hr tablet   FLUoxetine (PROZAC) 20 MG capsule      Currently in partial nearly full remission for depression, 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  Meds ordered this encounter  Medications  . buPROPion (WELLBUTRIN XL) 300 MG 24 hr tablet    Sig: Take 1 tablet (300 mg total) by mouth daily.    Dispense:  90 tablet    Refill:  1  . FLUoxetine (PROZAC) 20 MG capsule    Sig: Take 3 capsules (60 mg total) by mouth daily.    Dispense:  270 capsule    Refill:  1   Follow-up: - Return in 6 months for Annual Physical w/ PCP - Future labs to be ordered by PCP  Patient verbalizes understanding with the above medical recommendations including the limitation of remote medical advice.  Specific follow-up and call-back criteria were given for patient to follow-up or seek medical care more urgently if needed.  Total duration of direct patient care provided via video conference: 15 minutes   Saralyn Pilar, DO [facility] 03/28/2019, 9:52 AM

## 2019-09-23 ENCOUNTER — Other Ambulatory Visit: Payer: Self-pay | Admitting: Family Medicine

## 2019-09-23 DIAGNOSIS — F418 Other specified anxiety disorders: Secondary | ICD-10-CM

## 2019-09-23 DIAGNOSIS — F324 Major depressive disorder, single episode, in partial remission: Secondary | ICD-10-CM

## 2019-11-02 ENCOUNTER — Encounter: Payer: Self-pay | Admitting: Family Medicine

## 2019-11-02 ENCOUNTER — Ambulatory Visit (INDEPENDENT_AMBULATORY_CARE_PROVIDER_SITE_OTHER): Payer: 59 | Admitting: Family Medicine

## 2019-11-02 ENCOUNTER — Other Ambulatory Visit: Payer: Self-pay

## 2019-11-02 DIAGNOSIS — F418 Other specified anxiety disorders: Secondary | ICD-10-CM

## 2019-11-02 DIAGNOSIS — J0100 Acute maxillary sinusitis, unspecified: Secondary | ICD-10-CM | POA: Diagnosis not present

## 2019-11-02 MED ORDER — BUSPIRONE HCL 5 MG PO TABS: 5.0000 mg | ORAL_TABLET | Freq: Every day | ORAL | 2 refills | Status: DC

## 2019-11-02 MED ORDER — AMOXICILLIN-POT CLAVULANATE 875-125 MG PO TABS: 1.0000 | ORAL_TABLET | Freq: Two times a day (BID) | ORAL | 0 refills | Status: DC

## 2019-11-02 MED ORDER — CLONIDINE HCL 0.1 MG PO TABS: 0.1000 mg | ORAL_TABLET | Freq: Every evening | ORAL | 2 refills | Status: DC | PRN

## 2019-11-02 NOTE — [place] Notes (Signed)
Virtual Visit via Telephone The purpose of this virtual visit is to provide medical care while limiting exposure to the novel coronavirus (COVID19) for both patient and office staff.  Consent was obtained for phone visit:  Yes.   Answered questions that patient had about telehealth interaction:  Yes.   I discussed the limitations, risks, security and privacy concerns of performing an evaluation and management service by telephone. I also discussed with the patient that there may be a patient responsible charge related to this service. The patient expressed understanding and agreed to proceed.  Patient Location: [facility]  ---------------------------------------------------------------------- Chief Complaint  Patient presents with  . Sinusitis    patient state it started off like a toothache, but symptoms moved up in the sinus cavity. Sinus pressure around he eyes and maxillary sinus. x 8 days   . Anxiety    the pt also complains of worsening anxiety issues. She said that she is a recovering addict and cannot take control substance. She want to know if you will prescribe clonidine for her to take at bedtime to help her rest.     S: Reviewed CMA documentation. I have called patient and gathered additional HPI as follows:  Sinusitis Reports that symptoms started 8 days ago with worsening pressure and pain deeper in sinus, some drainage, has some foul taste and smell to drainage. She has had similar sinus infection before. No recent antibiotics. See ROS below  Anxiety/Mood Known chronic anxiety.  Significant family stressors, with her daughter (age 423) and her boyfriend She has poor sleep, and is anxious worried about her family and daughter She is constantly worrying about it Recovering drug addict for past 5 years. She was in treatment center in [place] in past. She states cannot take anything addictive such as benzo. She thinks in past she  was treated with clonidine nightly PRN with good results for anxiety. Cannot take Benadryl due to feeling   Denies any high risk travel to areas of current concern for COVID19. Denies any known or suspected exposure to person with or possibly with COVID19.  Denies any fevers, chills, sweats, body ache, cough, shortness of breath, headache, abdominal pain, diarrhea  Past Medical History:  Diagnosis Date  . Frequent headaches   . Headache    2x week tension, monthly migraine  . Substance abuse (HCC)    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   Social History   Tobacco Use  . Smoking status: Former Smoker    Packs/day: 0.25    Years: 5.00    Pack years: 1.25    Quit date: 12/01/2018    Years since quitting: 0.9  . Smokeless tobacco: Never Used  Substance Use Topics  . Alcohol use: Not Currently  . Drug use: Not Currently    Current Outpatient Medications:  .  buPROPion (WELLBUTRIN XL) 300 MG 24 hr tablet, TAKE 1 TABLET BY MOUTH EVERY DAY, Disp: 90 tablet, Rfl: 0 .  FLUoxetine (PROZAC) 20 MG capsule, TAKE 3 CAPSULES BY MOUTH EVERY DAY, Disp: 270 capsule, Rfl: 0 .  amoxicillin-clavulanate (AUGMENTIN) 875-125 MG tablet, Take 1 tablet by mouth 2 (two) times daily. For 10 days, Disp: 20 tablet, Rfl: 0 .  busPIRone (BUSPAR) 5 MG tablet, Take 1 tablet (5 mg total) by mouth at bedtime. May increase to 1.5 or 2 pills in future, Disp: 30 tablet, Rfl: 2 .  cloNIDine (CATAPRES) 0.1 MG tablet, Take 1-2 tablets (0.1-0.2 mg total) by mouth at bedtime  as needed (anxiety)., Disp: 30 tablet, Rfl: 2  Depression screen [facility] 2/9 11/02/2019 03/28/2019 09/24/2018  Decreased Interest 0 0 0  Down, Depressed, Hopeless 1 0 0  PHQ - 2 Score 1 0 0  Altered sleeping 1 1 1   Tired, decreased energy 0 1 1  Change in appetite 0 0 0  Feeling bad or failure about yourself  0 0 0  Trouble concentrating 1 0 1  Moving slowly or fidgety/restless 0 0 0  Suicidal thoughts 0 0 0  PHQ-9 Score 3 2 3   Difficult doing work/chores  Somewhat difficult Not difficult at all Not difficult at all    GAD 7 : Generalized Anxiety Score 11/02/2019 03/28/2019 09/24/2018 08/05/2018  Nervous, Anxious, on Edge 2 1 1 1   Control/stop worrying 2 1 2 2   Worry too much - different things 2 2 2 2   Trouble relaxing 2 1 2 1   Restless 2 0 2 1  Easily annoyed or irritable 2 0 1 1  Afraid - awful might happen 1 0 2 0  Total GAD 7 Score 13 5 12 8   Anxiety Difficulty Very difficult Not difficult at all Not difficult at all Somewhat difficult    -------------------------------------------------------------------------- O: No physical exam performed due to remote telephone encounter.  Lab results reviewed.  No results found for this or any previous visit (from the past 2160 hour(s)).  -------------------------------------------------------------------------- A&P:  Problem List Items Addressed This Visit    Anxiety associated with depression   Relevant Medications   busPIRone (BUSPAR) 5 MG tablet   cloNIDine (CATAPRES) 0.1 MG tablet    Other Visit Diagnoses    Acute non-recurrent maxillary sinusitis    -  Primary   Relevant Medications   amoxicillin-clavulanate (AUGMENTIN) 875-125 MG tablet     Consistent with acute maxillary sinusitis, worsening concern for bacterial infection.   Plan: 1 Start Augmentin 875-125mg  PO BID x 10 days Use OTC therapy as indicated Return criteria reviewed   #Anxiety Consistent with chronic anxiety, see past chart with additional background info Acute flare now with life stressors / family stress with daughter  Avoid BDZ/benzodiazpine medication due to her history of drug abuse and addiction dependence risk. She requests to avoid these medications and is not interested in them.  Will add Buspar 5mg  daily - can do afternoon or PM for anxiety may have to increase dose in future Also add PRN medicine Clonidine 0.1mg  nightly for anxiety PRN - can take 1-2 pills, caution rebound symptoms Continue  current Buprioprion and Fluoxetine   Meds ordered this encounter  Medications  . amoxicillin-clavulanate (AUGMENTIN) 875-125 MG tablet    Sig: Take 1 tablet by mouth 2 (two) times daily. For 10 days    Dispense:  20 tablet    Refill:  0  . busPIRone (BUSPAR) 5 MG tablet    Sig: Take 1 tablet (5 mg total) by mouth at bedtime. May increase to 1.5 or 2 pills in future    Dispense:  30 tablet    Refill:  2  . cloNIDine (CATAPRES) 0.1 MG tablet    Sig: Take 1-2 tablets (0.1-0.2 mg total) by mouth at bedtime as needed (anxiety).    Dispense:  30 tablet    Refill:  2    Follow-up: - Return in 3 months for anxiety w/ new provider  Patient verbalizes understanding with the above medical recommendations including the limitation of remote medical advice.  Specific follow-up and call-back criteria were given for patient to follow-up  or seek medical care more urgently if needed.   - Time spent in direct consultation with patient on phone: 11 minutes   Saralyn Pilar, DO [facility] 11/02/2019, 11:43 AM

## 2019-11-02 NOTE — Patient Instructions (Addendum)
AVS given by phone.  Please schedule a Follow-up Appointment to: Return in about 3 months (around 01/31/2020) for anxiety.  If you have any other questions or concerns, please feel free to call the office or send a message through [place]. You may also schedule an earlier appointment if necessary.  Additionally, you may be receiving a survey about your experience at our office within a few days to 1 week by e-mail or mail. We value your feedback.  Nobie Putnam, DO [place]

## 2019-11-24 ENCOUNTER — Other Ambulatory Visit: Payer: Self-pay | Admitting: Family Medicine

## 2019-11-24 DIAGNOSIS — F418 Other specified anxiety disorders: Secondary | ICD-10-CM

## 2019-12-18 ENCOUNTER — Other Ambulatory Visit: Payer: Self-pay | Admitting: Family Medicine

## 2019-12-18 DIAGNOSIS — F324 Major depressive disorder, single episode, in partial remission: Secondary | ICD-10-CM

## 2019-12-18 DIAGNOSIS — F418 Other specified anxiety disorders: Secondary | ICD-10-CM

## 2020-01-24 ENCOUNTER — Other Ambulatory Visit: Payer: Self-pay | Admitting: Family Medicine

## 2020-01-24 DIAGNOSIS — F418 Other specified anxiety disorders: Secondary | ICD-10-CM

## 2020-02-17 ENCOUNTER — Other Ambulatory Visit: Payer: Self-pay | Admitting: Family Medicine

## 2020-02-17 DIAGNOSIS — F418 Other specified anxiety disorders: Secondary | ICD-10-CM

## 2020-03-20 ENCOUNTER — Other Ambulatory Visit: Payer: Self-pay | Admitting: Family Medicine

## 2020-03-20 DIAGNOSIS — F418 Other specified anxiety disorders: Secondary | ICD-10-CM

## 2020-03-20 DIAGNOSIS — F324 Major depressive disorder, single episode, in partial remission: Secondary | ICD-10-CM

## 2020-03-20 NOTE — Telephone Encounter (Signed)
Requested Prescriptions  Pending Prescriptions Disp Refills  . buPROPion (WELLBUTRIN XL) 300 MG 24 hr tablet [Pharmacy Med Name: BUPROPION HCL XL 300 MG TABLET] 90 tablet 0    Sig: TAKE 1 TABLET BY MOUTH EVERY DAY     Psychiatry: Antidepressants - bupropion Passed - 03/20/2020  2:02 AM      Passed - Completed PHQ-2 or PHQ-9 in the last 360 days.      Passed - Last BP in normal range    BP Readings from Last 1 Encounters:  09/24/18 (!) 104/57         Passed - Valid encounter within last 6 months    Recent Outpatient Visits          4 months ago Acute non-recurrent maxillary sinusitis   [facility] [place], Netta Neat, DO   11 months ago Major depressive disorder with single episode, in partial remission [facility])   [facility] Althea Charon, Netta Neat, DO   1 year ago Encounter for annual physical exam   [facility] Galen Manila, NP   1 year ago Current moderate episode of major depressive disorder without prior episode [facility])   [facility] Galen Manila, NP   1 year ago Major depressive disorder with single episode, in partial remission [facility])   [facility], Alison Stalling, NP             . FLUoxetine (PROZAC) 20 MG capsule [Pharmacy Med Name: FLUOXETINE HCL 20 MG CAPSULE] 270 capsule 0    Sig: TAKE 3 CAPSULES BY MOUTH EVERY DAY     Psychiatry:  Antidepressants - SSRI Passed - 03/20/2020  2:02 AM      Passed - Completed PHQ-2 or PHQ-9 in the last 360 days.      Passed - Valid encounter within last 6 months    Recent Outpatient Visits          4 months ago Acute non-recurrent maxillary sinusitis   [facility] [place], Netta Neat, DO   11 months ago Major depressive disorder with single episode, in partial remission [facility])   [facility] Althea Charon, Netta Neat, DO   1 year ago Encounter for annual physical exam   [facility] Galen Manila, NP   1 year ago Current moderate episode of major depressive disorder without prior episode [facility])   [facility] Galen Manila, NP   1 year ago Major depressive disorder with single episode, in partial remission [facility])   [facility] Ky[place] Rudd, Alison Stalling, NP

## 2020-04-12 ENCOUNTER — Other Ambulatory Visit: Payer: Self-pay | Admitting: Family Medicine

## 2020-04-12 DIAGNOSIS — F324 Major depressive disorder, single episode, in partial remission: Secondary | ICD-10-CM

## 2020-04-12 DIAGNOSIS — F418 Other specified anxiety disorders: Secondary | ICD-10-CM

## 2020-04-12 NOTE — Telephone Encounter (Signed)
Requested Prescriptions  Pending Prescriptions Disp Refills  . FLUoxetine (PROZAC) 20 MG capsule [Pharmacy Med Name: FLUOXETINE HCL 20 MG CAPSULE] 90 capsule 0    Sig: TAKE 3 CAPSULES BY MOUTH EVERY DAY     Psychiatry:  Antidepressants - SSRI Passed - 04/12/2020  9:34 AM      Passed - Completed PHQ-2 or PHQ-9 in the last 360 days.      Passed - Valid encounter within last 6 months    Recent Outpatient Visits          5 months ago Acute non-recurrent maxillary sinusitis   [facility] [place], Netta Neat, DO   1 year ago Major depressive disorder with single episode, in partial remission [facility])   [facility] Al[facility] Charon, Netta Neat, DO   1 year ago Encounter for annual physical exam   [facility] Galen Manila, NP   1 year ago Current moderate episode of major depressive disorder without prior episode [facility])   [facility] Galen Manila, NP   1 year ago Major depressive disorder with single episode, in partial remission [facility])   [facility] Galen Manila, NP             phone call to pt. As overdue for annual physical.  Sched. Appt. 04/16/20 @ 8:20 AM; Courtesy refill #90 given.

## 2020-04-16 ENCOUNTER — Ambulatory Visit (INDEPENDENT_AMBULATORY_CARE_PROVIDER_SITE_OTHER): Payer: 59 | Admitting: Family Medicine

## 2020-04-16 ENCOUNTER — Other Ambulatory Visit: Payer: Self-pay

## 2020-04-16 ENCOUNTER — Encounter: Payer: Self-pay | Admitting: Family Medicine

## 2020-04-16 VITALS — BP 100/60 | HR 80 | Temp 97.7°F | Resp 16 | Ht 60.0 in | Wt 134.0 lb

## 2020-04-16 DIAGNOSIS — F331 Major depressive disorder, recurrent, moderate: Secondary | ICD-10-CM

## 2020-04-16 DIAGNOSIS — Z1231 Encounter for screening mammogram for malignant neoplasm of breast: Secondary | ICD-10-CM

## 2020-04-16 DIAGNOSIS — Z0000 Encounter for general adult medical examination without abnormal findings: Secondary | ICD-10-CM

## 2020-04-16 DIAGNOSIS — E782 Mixed hyperlipidemia: Secondary | ICD-10-CM

## 2020-04-16 DIAGNOSIS — R7309 Other abnormal glucose: Secondary | ICD-10-CM

## 2020-04-16 DIAGNOSIS — E7800 Pure hypercholesterolemia, unspecified: Secondary | ICD-10-CM | POA: Insufficient documentation

## 2020-04-16 DIAGNOSIS — F411 Generalized anxiety disorder: Secondary | ICD-10-CM | POA: Diagnosis not present

## 2020-04-16 DIAGNOSIS — E559 Vitamin D deficiency, unspecified: Secondary | ICD-10-CM | POA: Diagnosis not present

## 2020-04-16 MED ORDER — BUSPIRONE HCL 5 MG PO TABS: 5.0000 mg | ORAL_TABLET | Freq: Every day | ORAL | 1 refills | Status: DC

## 2020-04-16 MED ORDER — CLONIDINE HCL 0.1 MG PO TABS: 0.1000 mg | ORAL_TABLET | Freq: Every evening | ORAL | 1 refills | Status: DC | PRN

## 2020-04-16 MED ORDER — BUPROPION HCL ER (XL) 300 MG PO TB24: 300.0000 mg | ORAL_TABLET | Freq: Every day | ORAL | 1 refills | Status: DC

## 2020-04-16 MED ORDER — FLUOXETINE HCL 20 MG PO CAPS: 60.0000 mg | ORAL_CAPSULE | Freq: Every day | ORAL | 1 refills | Status: DC

## 2020-04-16 NOTE — Assessment & Plan Note (Signed)
Due for lipid panel today Prior elevated 2019 Not on medicine Encourage lifestyle

## 2020-04-16 NOTE — Assessment & Plan Note (Signed)
Recurrent chronic major depression, with active symptoms now Recent life stressor daughter using heroin causing major stress, had OD episode Assoc with comorbid anxiety and insomnia Improved on med management - continues on SSRI Fluoxetine high dose, Wellbutrin XL, Buspar, Clonidine nightly PRN Followed by counselor / therapist Offered Psychiatry - she declines today Refilled meds for 90 day Follow-up 6 months

## 2020-04-16 NOTE — Progress Notes (Signed)
Subjective:    Patient ID: Cheryl Smith, female    DOB: [birth year], 39 y.o.   MRN: [id number]  Cheryl Smith is a 39 y.o. female presenting on 04/16/2020 for Annual Exam   HPI   Here for Annual Physical and due for fasting lab only today.  HYPERLIPIDEMIA: - Reports no concerns. Last lipid panel 2019, elevated lipids LDL Not on Statin.  Major Depression, moderate recurrent / Anxiety Known chronic anxiety - but has had worse depression in past 1-2 years based off of life stressors. Significant family stressor with daughter using heroin and has had OD episode and is having significant problems. She is constantly worrying about it She has poor sleep, and is anxious worried about her family and daughter She has past history as recovering drug addict for past 5-6 years. She was in treatment center in [place] in past. She states cannot take anything addictive such as benzo. Cannot take Benadryl  Currently following - [facility] Taking Fluoxetine 20mg  x 3 = 60mg  daily Taking Bupropion XL 300mg  daily Taking Buspar 5mg  nightly at bedtime for anxiety Taking Clonidine 0.1 half or whole tab nightly as needed for insomnia - sometimes takes up to 2 pills, works well.  Health Maintenance: Has not completed COVID19 vaccine  No fam history breast cancer. Age 6+ due for mammogram if interested. She will check  S/p Hysterectomy with removal of cervix. She is not due for pap smears.  Depression screen [facility] 2/9 04/16/2020 11/02/2019 03/28/2019  Decreased Interest 2 0 0  Down, Depressed, Hopeless 3 1 0  PHQ - 2 Score 5 1 0  Altered sleeping 3 1 1   Tired, decreased energy 3 0 1  Change in appetite 2 0 0  Feeling bad or failure about yourself  2 0 0  Trouble concentrating 2 1 0  Moving slowly or fidgety/restless 0 0 0  Suicidal thoughts 0 0 0  PHQ-9 Score 17 3 2   Difficult doing work/chores Extremely dIfficult Somewhat difficult Not difficult at all   GAD 7 : Generalized Anxiety  Score 04/16/2020 11/02/2019 03/28/2019 09/24/2018  Nervous, Anxious, on Edge 3 2 1 1   Control/stop worrying 3 2 1 2   Worry too much - different things 3 2 2 2   Trouble relaxing 3 2 1 2   Restless 2 2 0 2  Easily annoyed or irritable 2 2 0 1  Afraid - awful might happen 3 1 0 2  Total GAD 7 Score 19 13 5 12   Anxiety Difficulty Extremely difficult Very difficult Not difficult at all Not difficult at all    Past Medical History:  Diagnosis Date  . Frequent headaches   . Headache    2x week tension, monthly migraine  . Substance abuse ([place])    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   Past Surgical History:  Procedure Laterality Date  . VAGINAL HYSTERECTOMY Left 2012   Rt ovary remains   Social History   Socioeconomic History  . Marital status: Significant Other    Spouse name: Not on file  . Number of children: 1  . Years of education: Not on file  . Highest education level: Associate degree: occupational, Hotel manager, or vocational program  Occupational History  . Not on file  Tobacco Use  . Smoking status: Former Smoker    Packs/day: 0.25    Years: 5.00    Pack years: 1.25    Quit date: 12/01/2018    Years since quitting: 1.3  . Smokeless tobacco: Never Used  Substance and Sexual Activity  . Alcohol use: Not Currently  . Drug use: Not Currently  . Sexual activity: Yes    Birth control/protection: Surgical  Other Topics Concern  . Not on file  Social History Narrative  . Not on file   Social Determinants of Health   Financial Resource Strain:   . Difficulty of Paying Living Expenses:   Food Insecurity:   . Worried About Programme researcher, broadcasting/film/videounning Out of Food in the Last Year:   . Baristaan Out of Food in the Last Year:   Transportation Needs:   . Freight forwarderLack of Transportation (Medical):   Marland Kitchen. Lack of Transportation (Non-Medical):   Physical Activity:   . Days of Exercise per Week:   . Minutes of Exercise per Session:   Stress:   . Feeling of Stress :   Social Connections:   . Frequency of Communication  with Friends and Family:   . Frequency of Social Gatherings with Friends and Family:   . Attends Religious Services:   . Active Member of Clubs or Organizations:   . Attends BankerClub or Organization Meetings:   Marland Kitchen. Marital Status:   Intimate Partner Violence:   . Fear of Current or Ex-Partner:   . Emotionally Abused:   Marland Kitchen. Physically Abused:   . Sexually Abused:    Family History  Problem Relation Age of Onset  . Depression Mother   . Kidney cancer Maternal Grandmother   . Heart disease Maternal Grandmother   . Diabetes Maternal Grandfather   . Heart disease Paternal Grandmother   . Stroke Paternal Grandmother   . Breast cancer Neg Hx    No current outpatient medications on file prior to visit.   No current facility-administered medications on file prior to visit.    Review of Systems  Constitutional: Negative for activity change, appetite change, chills, diaphoresis, fatigue and fever.  HENT: Negative for congestion and hearing loss.   Eyes: Negative for visual disturbance.  Respiratory: Negative for apnea, cough, chest tightness, shortness of breath and wheezing.   Cardiovascular: Negative for chest pain, palpitations and leg swelling.  Gastrointestinal: Negative for abdominal pain, blood in stool, constipation, diarrhea, nausea and vomiting.  Endocrine: Negative for cold intolerance and polyuria.  Genitourinary: Negative for difficulty urinating, dysuria, frequency and hematuria.  Musculoskeletal: Negative for arthralgias, back pain and neck pain.  Skin: Negative for rash.  Allergic/Immunologic: Negative for environmental allergies.  Neurological: Negative for dizziness, weakness, light-headedness, numbness and headaches.  Hematological: Negative for adenopathy.  Psychiatric/Behavioral: Positive for dysphoric mood and sleep disturbance. Negative for behavioral problems. The patient is nervous/anxious.    Per HPI unless specifically indicated above      Objective:    BP  100/60   Pulse 80   Temp 97.7 F (36.5 C) (Temporal)   Resp 16   Ht 5' (1.524 m)   Wt 134 lb (60.8 kg)   BMI 26.17 kg/m   Wt Readings from Last 3 Encounters:  04/16/20 134 lb (60.8 kg)  09/24/18 135 lb 12.8 oz (61.6 kg)  08/05/18 133 lb 3.2 oz (60.4 kg)    Physical Exam Vitals and nursing note reviewed.  Constitutional:      General: She is not in acute distress.    Appearance: She is well-developed. She is not diaphoretic.     Comments: Well-appearing, comfortable, cooperative  HENT:     Head: Normocephalic and atraumatic.  Eyes:     General:        Right eye: No discharge.  Left eye: No discharge.     Conjunctiva/sclera: Conjunctivae normal.     Pupils: Pupils are equal, round, and reactive to light.  Neck:     Thyroid: No thyromegaly.  Cardiovascular:     Rate and Rhythm: Normal rate and regular rhythm.     Heart sounds: Normal heart sounds. No murmur.  Pulmonary:     Effort: Pulmonary effort is normal. No respiratory distress.     Breath sounds: Normal breath sounds. No wheezing or rales.  Abdominal:     General: Bowel sounds are normal. There is no distension.     Palpations: Abdomen is soft. There is no mass.     Tenderness: There is no abdominal tenderness.  Musculoskeletal:        General: No tenderness. Normal range of motion.     Cervical back: Normal range of motion and neck supple.     Comments: Upper / Lower Extremities: - Normal muscle tone, strength bilateral upper extremities 5/5, lower extremities 5/5  Lymphadenopathy:     Cervical: No cervical adenopathy.  Skin:    General: Skin is warm and dry.     Findings: No erythema or rash.  Neurological:     Mental Status: She is alert and oriented to person, place, and time.     Comments: Distal sensation intact to light touch all extremities  Psychiatric:        Behavior: Behavior normal.     Comments: Well groomed, good eye contact, normal speech and thoughts    Results for orders placed or  performed in visit on 09/24/18  TSH  Result Value Ref Range   TSH 2.26 mIU/L  Lipid panel  Result Value Ref Range   Cholesterol 283 (H) <200 mg/dL   HDL 84 >83 mg/dL   Triglycerides 419 <622 mg/dL   LDL Cholesterol (Calc) 176 (H) mg/dL (calc)   Total CHOL/HDL Ratio 3.4 <5.0 (calc)   Non-HDL Cholesterol (Calc) 199 (H) <130 mg/dL (calc)  COMPLETE METABOLIC PANEL WITH GFR  Result Value Ref Range   Glucose, Bld 102 (H) 65 - 99 mg/dL   BUN 14 7 - 25 mg/dL   Creat 2.97 9.89 - 2.11 mg/dL   GFR, Est Non African American 69 > OR = 60 mL/min/1.41m2   GFR, Est African American 80 > OR = 60 mL/min/1.21m2   BUN/Creatinine Ratio NOT APPLICABLE 6 - 22 (calc)   Sodium 141 135 - 146 mmol/L   Potassium 4.8 3.5 - 5.3 mmol/L   Chloride 105 98 - 110 mmol/L   CO2 27 20 - 32 mmol/L   Calcium 9.1 8.6 - 10.2 mg/dL   Total Protein 6.4 6.1 - 8.1 g/dL   Albumin 4.0 3.6 - 5.1 g/dL   Globulin 2.4 1.9 - 3.7 g/dL (calc)   AG Ratio 1.7 1.0 - 2.5 (calc)   Total Bilirubin 0.3 0.2 - 1.2 mg/dL   Alkaline phosphatase (APISO) 73 33 - 115 U/L   AST 16 10 - 30 U/L   ALT 13 6 - 29 U/L  CBC with Differential/Platelet  Result Value Ref Range   WBC 4.6 3.8 - 10.8 Thousand/uL   RBC 4.12 3.80 - 5.10 Million/uL   Hemoglobin 12.8 11.7 - 15.5 g/dL   HCT 94.1 74.0 - 81.4 %   MCV 89.8 80.0 - 100.0 fL   MCH 31.1 27.0 - 33.0 pg   MCHC 34.6 32.0 - 36.0 g/dL   RDW 48.1 85.6 - 31.4 %   Platelets 347 140 -  400 Thousand/uL   MPV 10.2 7.5 - 12.5 fL   Neutro Abs 1,831 1,500 - 7,800 cells/uL   Lymphs Abs 1,776 850 - 3,900 cells/uL   WBC mixed population 543 200 - 950 cells/uL   Eosinophils Absolute 409 15 - 500 cells/uL   Basophils Absolute 41 0 - 200 cells/uL   Neutrophils Relative % 39.8 %   Total Lymphocyte 38.6 %   Monocytes Relative 11.8 %   Eosinophils Relative 8.9 %   Basophils Relative 0.9 %  Hemoglobin A1c  Result Value Ref Range   Hgb A1c MFr Bld 5.4 <5.7 % of total Hgb   Mean Plasma Glucose 108 (calc)   eAG  (mmol/L) 6.0 (calc)      Assessment & Plan:   Problem List Items Addressed This Visit    Mixed hyperlipidemia    Due for lipid panel today Prior elevated 2019 Not on medicine Encourage lifestyle      Relevant Medications   cloNIDine (CATAPRES) 0.1 MG tablet   Other Relevant Orders   Lipid panel   TSH   Major depressive disorder, recurrent, moderate (HCC)    Recurrent chronic major depression, with active symptoms now Recent life stressor daughter using heroin causing major stress, had OD episode Assoc with comorbid anxiety and insomnia Improved on med management - continues on SSRI Fluoxetine high dose, Wellbutrin XL, Buspar, Clonidine nightly PRN Followed by counselor / therapist Offered Psychiatry - she declines today Refilled meds for 90 day Follow-up 6 months      Relevant Medications   FLUoxetine (PROZAC) 20 MG capsule   busPIRone (BUSPAR) 5 MG tablet   buPROPion (WELLBUTRIN XL) 300 MG 24 hr tablet   Other Relevant Orders   CBC with Differential/Platelet   COMPLETE METABOLIC PANEL WITH GFR   GAD (generalized anxiety disorder)    Stable chronic anxiety Episodic worse flares with life stressors and mood/depression comorbid See A&P Depression Followed by counselor / therapist On med management SSRI Fluoxetine, Buspar, Clonidine nightly PRN      Relevant Medications   FLUoxetine (PROZAC) 20 MG capsule   cloNIDine (CATAPRES) 0.1 MG tablet   busPIRone (BUSPAR) 5 MG tablet   buPROPion (WELLBUTRIN XL) 300 MG 24 hr tablet   Other Relevant Orders   COMPLETE METABOLIC PANEL WITH GFR    Other Visit Diagnoses    Annual physical exam    -  Primary   Relevant Orders   Hemoglobin A1c   CBC with Differential/Platelet   COMPLETE METABOLIC PANEL WITH GFR   Lipid panel   TSH   Vitamin D deficiency       Relevant Orders   VITAMIN D 25 Hydroxy (Vit-D Deficiency, Fractures)   Abnormal glucose       Relevant Orders   Hemoglobin A1c   Encounter for screening mammogram  for malignant neoplasm of breast       Relevant Orders   MM DIGITAL SCREENING BILATERAL       Updated Health Maintenance information - Due mammogram screening routine age 65+, no fam history, she will check w insurance and schedule, order placed for [facility] 2 Lab orders placed for today, f/u results Encouraged improvement to lifestyle with diet and exercise Maintain healthy weight  Meds ordered this encounter  Medications  . FLUoxetine (PROZAC) 20 MG capsule    Sig: Take 3 capsules (60 mg total) by mouth daily.    Dispense:  270 capsule    Refill:  1    Courtesy refill given for #90;  pt. Due for annual physical; sched. 5/17  . cloNIDine (CATAPRES) 0.1 MG tablet    Sig: Take 1-2 tablets (0.1-0.2 mg total) by mouth at bedtime as needed (anxiety).    Dispense:  180 tablet    Refill:  1  . busPIRone (BUSPAR) 5 MG tablet    Sig: Take 1 tablet (5 mg total) by mouth at bedtime.    Dispense:  90 tablet    Refill:  1    F41.8  . buPROPion (WELLBUTRIN XL) 300 MG 24 hr tablet    Sig: Take 1 tablet (300 mg total) by mouth daily.    Dispense:  90 tablet    Refill:  1      Follow up plan: Return in about 6 months (around 10/17/2020) for 6 month follow-up Depression/Anxiety meds.  Saralyn Pilar, DO [facility] 04/16/2020, 8:30 AM

## 2020-04-16 NOTE — Assessment & Plan Note (Addendum)
Stable chronic anxiety Episodic worse flares with life stressors and mood/depression comorbid See A&P Depression Followed by counselor / therapist On med management SSRI Fluoxetine, Buspar, Clonidine nightly PRN

## 2020-04-16 NOTE — Patient Instructions (Addendum)
Thank you for coming to the office today.  Lab orders today. Stay tuned for results. Will call.  All meds re ordered. For 90 day.  For Mammogram screening for breast cancer - check with insurance to see if they will cover routine screening mammogram.  Call the Imaging Center below anytime to schedule your own appointment now that order has been placed.  [facility] [address] [place], [place] [address] Phone: [phone]   Please schedule a Follow-up Appointment to: Return in about 6 months (around 10/17/2020) for 6 month follow-up Depression/Anxiety meds.  If you have any other questions or concerns, please feel free to call the office or send a message through MyChart. You may also schedule an earlier appointment if necessary.  Additionally, you may be receiving a survey about your experience at our office within a few days to 1 week by e-mail or mail. We value your feedback.  Saralyn Pilar, DO [facility], [place]

## 2020-04-17 LAB — COMPLETE METABOLIC PANEL WITH GFR
AG Ratio: 2.1 (calc) (ref 1.0–2.5)
ALT: 13 U/L (ref 6–29)
AST: 13 U/L (ref 10–30)
Albumin: 4.2 g/dL (ref 3.6–5.1)
Alkaline phosphatase (APISO): 71 U/L (ref 31–125)
BUN: 11 mg/dL (ref 7–25)
CO2: 31 mmol/L (ref 20–32)
Calcium: 9.3 mg/dL (ref 8.6–10.2)
Chloride: 102 mmol/L (ref 98–110)
Creat: 1 mg/dL (ref 0.50–1.10)
GFR, Est African American: 81 mL/min/{1.73_m2} (ref >=60)
GFR, Est Non African American: 70 mL/min/{1.73_m2} (ref >=60)
Globulin: 2 g/dL (calc) (ref 1.9–3.7)
Glucose, Bld: 83 mg/dL (ref 65–99)
Potassium: 4.4 mmol/L (ref 3.5–5.3)
Sodium: 140 mmol/L (ref 135–146)
Total Bilirubin: 0.3 mg/dL (ref 0.2–1.2)
Total Protein: 6.2 g/dL (ref 6.1–8.1)

## 2020-04-17 LAB — CBC WITH DIFFERENTIAL/PLATELET
Absolute Monocytes: 408 cells/uL (ref 200–950)
Basophils Absolute: 42 cells/uL (ref 0–200)
Basophils Relative: 0.8 %
Eosinophils Absolute: 292 cells/uL (ref 15–500)
Eosinophils Relative: 5.5 %
HCT: 38.1 % (ref 35.0–45.0)
Hemoglobin: 12.7 g/dL (ref 11.7–15.5)
Lymphs Abs: 1860 cells/uL (ref 850–3900)
MCH: 30.8 pg (ref 27.0–33.0)
MCHC: 33.3 g/dL (ref 32.0–36.0)
MCV: 92.5 fL (ref 80.0–100.0)
MPV: 10.5 fL (ref 7.5–12.5)
Monocytes Relative: 7.7 %
Neutro Abs: 2698 cells/uL (ref 1500–7800)
Neutrophils Relative %: 50.9 %
Platelets: 380 10*3/uL (ref 140–400)
RBC: 4.12 10*6/uL (ref 3.80–5.10)
RDW: 12.1 % (ref 11.0–15.0)
Total Lymphocyte: 35.1 %
WBC: 5.3 10*3/uL (ref 3.8–10.8)

## 2020-04-17 LAB — LIPID PANEL
Cholesterol: 288 mg/dL — ABNORMAL HIGH (ref <200)
HDL: 93 mg/dL (ref >=50)
LDL Cholesterol (Calc): 177 mg/dL (calc) — ABNORMAL HIGH
Non-HDL Cholesterol (Calc): 195 mg/dL (calc) — ABNORMAL HIGH (ref <130)
Total CHOL/HDL Ratio: 3.1 (calc) (ref <5.0)
Triglycerides: 77 mg/dL (ref <150)

## 2020-04-17 LAB — VITAMIN D 25 HYDROXY (VIT D DEFICIENCY, FRACTURES): Vit D, 25-Hydroxy: 27 ng/mL — ABNORMAL LOW (ref 30–100)

## 2020-04-17 LAB — HEMOGLOBIN A1C
Hgb A1c MFr Bld: 5.1 % of total Hgb (ref <5.7)
Mean Plasma Glucose: 100 (calc)
eAG (mmol/L): 5.5 (calc)

## 2020-04-17 LAB — TSH: TSH: 2.31 mIU/L

## 2020-04-20 ENCOUNTER — Encounter: Payer: Self-pay | Admitting: Family Medicine

## 2020-07-09 ENCOUNTER — Ambulatory Visit: Payer: Self-pay

## 2020-07-09 ENCOUNTER — Ambulatory Visit: Payer: 59 | Attending: Critical Care Medicine

## 2020-07-09 NOTE — Progress Notes (Signed)
   Covid-19 Vaccination Clinic  Name:  Cheryl Smith    MRN: [id number] DOB: 16-May-1981  07/09/2020  Ms. Cheryl Smith was observed post Covid-19 immunization for 15 minutes without incident. She was provided with Vaccine Information Sheet and instruction to access the V-Safe system.   Ms. Cheryl Smith was instructed to call 911 with any severe reactions post vaccine: Marland Kitchen Difficulty breathing  . Swelling of face and throat  . A fast heartbeat  . A bad rash all over body  . Dizziness and weakness

## 2020-08-06 ENCOUNTER — Ambulatory Visit: Payer: 59

## 2020-12-10 ENCOUNTER — Other Ambulatory Visit: Payer: Self-pay | Admitting: Family Medicine

## 2020-12-10 DIAGNOSIS — F331 Major depressive disorder, recurrent, moderate: Secondary | ICD-10-CM

## 2020-12-10 DIAGNOSIS — F411 Generalized anxiety disorder: Secondary | ICD-10-CM

## 2020-12-14 ENCOUNTER — Telehealth: Payer: Self-pay

## 2020-12-14 NOTE — Telephone Encounter (Signed)
Copied from CRM [id number]. Topic: General - Other >> Dec 14, 2020 10:24 AM Tamela Oddi wrote: Reason for CRM: Patient would like to speak with the nurse regarding her medication before her scheduled appt. On Tuesday.  She stated that she will run out of meds on Sunday and would like a courtesy refill.  Please advise and call patient to discuss at [phone]

## 2020-12-18 ENCOUNTER — Ambulatory Visit (INDEPENDENT_AMBULATORY_CARE_PROVIDER_SITE_OTHER): Payer: BC Managed Care – PPO | Admitting: Family Medicine

## 2020-12-18 ENCOUNTER — Encounter: Payer: Self-pay | Admitting: Family Medicine

## 2020-12-18 ENCOUNTER — Other Ambulatory Visit: Payer: Self-pay

## 2020-12-18 VITALS — BP 112/70 | HR 90 | Ht 60.0 in | Wt 140.8 lb

## 2020-12-18 DIAGNOSIS — F411 Generalized anxiety disorder: Secondary | ICD-10-CM

## 2020-12-18 DIAGNOSIS — N3000 Acute cystitis without hematuria: Secondary | ICD-10-CM | POA: Diagnosis not present

## 2020-12-18 DIAGNOSIS — F331 Major depressive disorder, recurrent, moderate: Secondary | ICD-10-CM

## 2020-12-18 DIAGNOSIS — B379 Candidiasis, unspecified: Secondary | ICD-10-CM

## 2020-12-18 LAB — POCT URINALYSIS DIPSTICK
Ketones, UA: NEGATIVE
Nitrite, UA: NEGATIVE
Protein, UA: POSITIVE — AB
Spec Grav, UA: 1.025 (ref 1.010–1.025)
Urobilinogen, UA: 0.2 E.U./dL
pH, UA: 5 (ref 5.0–8.0)

## 2020-12-18 MED ORDER — CLONIDINE HCL 0.1 MG PO TABS: 0.1000 mg | ORAL_TABLET | Freq: Every evening | ORAL | 1 refills | Status: DC | PRN

## 2020-12-18 MED ORDER — BUPROPION HCL ER (XL) 300 MG PO TB24: 300.0000 mg | ORAL_TABLET | Freq: Every day | ORAL | 1 refills | Status: DC

## 2020-12-18 MED ORDER — BUSPIRONE HCL 5 MG PO TABS
5.0000 mg | ORAL_TABLET | Freq: Every day | ORAL | 1 refills | Status: DC
Start: 2020-12-18 — End: 2021-08-06

## 2020-12-18 MED ORDER — FLUOXETINE HCL 20 MG PO CAPS: 60.0000 mg | ORAL_CAPSULE | Freq: Every day | ORAL | 1 refills | Status: DC

## 2020-12-18 MED ORDER — FLUCONAZOLE 150 MG PO TABS: ORAL_TABLET | ORAL | 0 refills | Status: DC

## 2020-12-18 MED ORDER — CEPHALEXIN 500 MG PO CAPS
500.0000 mg | ORAL_CAPSULE | Freq: Three times a day (TID) | ORAL | 0 refills | Status: DC
Start: 2020-12-18 — End: 2020-12-20

## 2020-12-18 NOTE — Patient Instructions (Addendum)
Thank you for coming to the office today.  1. You have a Urinary Tract Infection - this is very common, your symptoms are reassuring and you should get better within 1 week on the antibiotics - Start Keflex 500mg  3 times daily for next 7 days, complete entire course, even if feeling better - We sent urine for a culture, we will call you within next few days if we need to change antibiotics - Please drink plenty of fluids, improve hydration over next 1 week  If symptoms worsening, developing nausea / vomiting, worsening back pain, fevers / chills / sweats, then please return for re-evaluation sooner.  If you take AZO OTC - limit this to 2-3 days MAX to avoid affecting kidneys  D-Mannose is a natural supplement that can actually help bind to urinary bacteria and reduce their effectiveness it can help prevent UTI from forming, and may reduce some symptoms. It likely cannot cure an active UTI but it is worth a try and good to prevent them with. Try 500mg  twice a day at a full dose if you want, or check package instructions for more info  Refilled all other meds for 6 months.  DUE for FASTING BLOOD WORK (no food or drink after midnight before the lab appointment, only water or coffee without cream/sugar on the morning of)  SCHEDULE "Lab Only" visit in the morning at the clinic for lab draw in 6 MONTHS   - Make sure Lab Only appointment is at about 1 week before your next appointment, so that results will be available  For Lab Results, once available within 2-3 days of blood draw, you can can log in to MyChart online to view your results and a brief explanation. Also, we can discuss results at next follow-up visit.   Please schedule a Follow-up Appointment to: Return in about 6 months (around 06/17/2021) for 6 month fasting lab only then 1 week later Annual Physical.  If you have any other questions or concerns, please feel free to call the office or send a message through MyChart. You may also  schedule an earlier appointment if necessary.  Additionally, you may be receiving a survey about your experience at our office within a few days to 1 week by e-mail or mail. We value your feedback.  , DO [facility], Saralyn Pilar

## 2020-12-18 NOTE — Progress Notes (Signed)
Subjective:    Patient ID: Cheryl Smith, female    DOB: [birth year], 40 y.o.   MRN: [id number]  Cheryl Smith is a 40 y.o. female presenting on 12/18/2020 for Depression and Urinary Frequency   HPI   UTI Reports symptoms started 3 weeks ago, seems to be fairly persistent urinary urgency with some episodic pelvic pains and R side low back/abdomen, she tried AZO for this PRN 4 days at a time then stopped and tried again - temporary relief only. Past history of UTI very similar to this, no recent UTI. - Denies any hematuria, fever chills, nausea vomiting, dysuria  Major Depression, moderate recurrent / Anxiety Known chronic anxiety - but has had worse depression in past >2 years based off of life stressors. Significant family stressor with daughter history of drug use Admits history of some poor sleep and feeling tired still Overall improved on current medications. 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 She was in treatment center in [place] in past.She states cannot take anything addictive such as benzo. Cannot take Benadryl  Currently following - [facility] Taking Fluoxetine 20mg  x 3 = 60mg  daily Taking Bupropion XL 300mg  daily Taking Buspar 5mg  nightly at bedtime for anxiety Taking Clonidine 0.1, half, one or two as needed for insomnia - sometimes takes up to 2 pills, works well.   Health Maintenance:  COVID Vaccine 1 of 2 - Moderna 07/09/20  Depression screen [facility] 2/9 12/18/2020 04/16/2020 11/02/2019  Decreased Interest 1 2 0  Down, Depressed, Hopeless 1 3 1   PHQ - 2 Score 2 5 1   Altered sleeping 2 3 1   Tired, decreased energy 3 3 0  Change in appetite 3 2 0  Feeling bad or failure about yourself  1 2 0  Trouble concentrating 0 2 1  Moving slowly or fidgety/restless 0 0 0  Suicidal thoughts 0 0 0  PHQ-9 Score 11 17 3   Difficult doing work/chores Somewhat difficult  Extremely dIfficult Somewhat difficult   GAD 7 : Generalized Anxiety Score 12/18/2020 04/16/2020 11/02/2019 03/28/2019  Nervous, Anxious, on Edge 2 3 2 1   Control/stop worrying 3 3 2 1   Worry too much - different things 3 3 2 2   Trouble relaxing 3 3 2 1   Restless 0 2 2 0  Easily annoyed or irritable 1 2 2  0  Afraid - awful might happen 1 3 1  0  Total GAD 7 Score 13 19 13 5   Anxiety Difficulty Somewhat difficult Extremely difficult Very difficult Not difficult at all     Social History   Tobacco Use  . Smoking status: Former Smoker    Packs/day: 0.25    Years: 5.00    Pack years: 1.25    Quit date: 12/01/2018    Years since quitting: 2.0  . Smokeless tobacco: Never Used  Vaping Use  . Vaping Use: Never used  Substance Use Topics  . Alcohol use: Not Currently  . Drug use: Not Currently    Review of Systems Per HPI unless specifically indicated above     Objective:    BP 112/70   Pulse 90   Ht 5' (1.524 m)   Wt 140 lb 12.8 oz (63.9 kg)   SpO2 98%   BMI 27.50 kg/m   Wt Readings from Last 3 Encounters:  12/18/20 140 lb 12.8 oz (63.9 kg)  04/16/20 134 lb (60.8 kg)  09/24/18  135 lb 12.8 oz (61.6 kg)    Physical Exam Vitals and nursing note reviewed.  Constitutional:      General: She is not in acute distress.    Appearance: She is well-developed and well-nourished. She is not diaphoretic.     Comments: Well-appearing, comfortable, cooperative  HENT:     Head: Normocephalic and atraumatic.     Mouth/Throat:     Mouth: Oropharynx is clear and moist.  Eyes:     General:        Right eye: No discharge.        Left eye: No discharge.     Conjunctiva/sclera: Conjunctivae normal.  Cardiovascular:     Rate and Rhythm: Normal rate.  Pulmonary:     Effort: Pulmonary effort is normal.  Musculoskeletal:        General: No edema.  Skin:    General: Skin is warm and dry.     Findings: No erythema or rash.  Neurological:     Mental Status: She is alert and oriented to  person, place, and time.  Psychiatric:        Mood and Affect: Mood and affect normal.        Behavior: Behavior normal.     Comments: Well groomed, good eye contact, normal speech and thoughts        Results for orders placed or performed in visit on 12/18/20  POCT urinalysis dipstick  Result Value Ref Range   Color, UA orange    Clarity, UA clear    Glucose, UA     Bilirubin, UA small    Ketones, UA Negative    Spec Grav, UA 1.025 1.010 - 1.025   Blood, UA Moderate    pH, UA 5.0 5.0 - 8.0   Protein, UA Positive (A) Negative   Urobilinogen, UA 0.2 0.2 or 1.0 E.U./dL   Nitrite, UA Negative    Leukocytes, UA Small (1+) (A) Negative   Appearance     Odor        Assessment & Plan:   Problem List Items Addressed This Visit    Major depressive disorder, recurrent, moderate (HCC) - Primary    Recurrent chronic major depression, with active symptoms now but improved Life stressors with daughter drug use. Patient has substance history as well. Assoc with comorbid anxiety and insomnia Improved on med management - continues on SSRI Fluoxetine high dose, Wellbutrin XL, Buspar, Clonidine nightly PRN - refill medications today Followed by counselor / therapist Follow-up 6 months      Relevant Medications   buPROPion (WELLBUTRIN XL) 300 MG 24 hr tablet   busPIRone (BUSPAR) 5 MG tablet   FLUoxetine (PROZAC) 20 MG capsule   GAD (generalized anxiety disorder)    Stable chronic anxiety on current regimen Episodic worse flares with life stressors and mood/depression comorbid See A&P Depression Followed by counselor / therapist - has not returned in a while On med management SSRI Fluoxetine, Buspar, Clonidine nightly PRN      Relevant Medications   buPROPion (WELLBUTRIN XL) 300 MG 24 hr tablet   busPIRone (BUSPAR) 5 MG tablet   cloNIDine (CATAPRES) 0.1 MG tablet   FLUoxetine (PROZAC) 20 MG capsule    Other Visit Diagnoses    Acute cystitis without hematuria       Relevant  Medications   cephALEXin (KEFLEX) 500 MG capsule   Other Relevant Orders   POCT urinalysis dipstick (Completed)   Urine Culture   Antibiotic-induced yeast infection  Relevant Medications   cephALEXin (KEFLEX) 500 MG capsule   fluconazole (DIFLUCAN) 150 MG tablet       Clinically consistent with UTI and confirmed on UA. No recent UTIs or abx courses.  No concern for pyelo today (no systemic symptoms, neg fever, back pain, n/v).  Plan: 1. UA POC done today, consistent with UTI 2. Ordered Urine culture 3. Keflex 500mg  TID x 7 days 4. Improve PO hydration 5. RTC if no improvement 1-2 weeks, red flags given to return sooner   Meds ordered this encounter  Medications  . buPROPion (WELLBUTRIN XL) 300 MG 24 hr tablet    Sig: Take 1 tablet (300 mg total) by mouth daily.    Dispense:  90 tablet    Refill:  1  . busPIRone (BUSPAR) 5 MG tablet    Sig: Take 1 tablet (5 mg total) by mouth at bedtime.    Dispense:  90 tablet    Refill:  1    F41.8  . cloNIDine (CATAPRES) 0.1 MG tablet    Sig: Take 1-2 tablets (0.1-0.2 mg total) by mouth at bedtime as needed (anxiety).    Dispense:  180 tablet    Refill:  1  . FLUoxetine (PROZAC) 20 MG capsule    Sig: Take 3 capsules (60 mg total) by mouth daily.    Dispense:  270 capsule    Refill:  1  . cephALEXin (KEFLEX) 500 MG capsule    Sig: Take 1 capsule (500 mg total) by mouth 3 (three) times daily. For 7 days    Dispense:  21 capsule    Refill:  0  . fluconazole (DIFLUCAN) 150 MG tablet    Sig: Take one tablet by mouth on Day 1. Repeat dose 2nd tablet on Day 3.    Dispense:  2 tablet    Refill:  0     Follow up plan: Return in about 6 months (around 06/17/2021) for 6 month fasting lab only then 1 week later Annual Physical.   06/19/2021, DO [facility] 12/18/2020, 11:27 AM

## 2020-12-18 NOTE — Assessment & Plan Note (Signed)
Stable chronic anxiety on current regimen Episodic worse flares with life stressors and mood/depression comorbid See A&P Depression Followed by counselor / therapist - has not returned in a while On med management SSRI Fluoxetine, Buspar, Clonidine nightly PRN

## 2020-12-18 NOTE — Assessment & Plan Note (Signed)
Recurrent chronic major depression, with active symptoms now but improved Life stressors with daughter drug use. Patient has substance history as well. Assoc with comorbid anxiety and insomnia Improved on med management - continues on SSRI Fluoxetine high dose, Wellbutrin XL, Buspar, Clonidine nightly PRN - refill medications today Followed by counselor / therapist Follow-up 6 months

## 2020-12-20 DIAGNOSIS — N3000 Acute cystitis without hematuria: Secondary | ICD-10-CM

## 2020-12-20 LAB — URINE CULTURE
MICRO NUMBER:: 11428819
SPECIMEN QUALITY:: ADEQUATE

## 2020-12-20 MED ORDER — NITROFURANTOIN MONOHYD MACRO 100 MG PO CAPS: 100.0000 mg | ORAL_CAPSULE | Freq: Two times a day (BID) | ORAL | 0 refills | Status: DC

## 2020-12-28 NOTE — Addendum Note (Signed)
Addended by: Smitty Cords on: 12/28/2020 08:18 AM   Modules accepted: Orders

## 2021-01-15 ENCOUNTER — Ambulatory Visit: Payer: BC Managed Care – PPO | Admitting: Urology

## 2021-01-29 ENCOUNTER — Ambulatory Visit: Payer: BC Managed Care – PPO | Admitting: Urology

## 2021-01-29 ENCOUNTER — Encounter: Payer: Self-pay | Admitting: Urology

## 2021-01-29 NOTE — Progress Notes (Incomplete)
01/29/2021 1:25 PM   Nicolas Aguado October 15, 1981 [id number]  Referring provider: Smitty Cords, DO [address] [place],  [place] [address]  No chief complaint on file.   HPI: 40 year old female who presents today for further evaluation of UTI.  She was seen and evaluated by her primary care and once 18 2022 complaining of several weeks of urgency, pelvic pain and right-sided low back abdominal pain.  Urinalysis was mildly suspicious and ended up growing a low colony count of E. coli resistant to ampicillin intermediately resistant to Augmentin resistant to Kefzol and intermediately resistant to Cipro/Levaquin.  Initially she was treated with Keflex and once cultures resulted, she was transitioned over to [facility] for only 7-day course.  When her symptoms fail to improve she was referred to urology.  She has no significant history of recurrent infections.  No recent upper tract imaging.   PMH: Past Medical History:  Diagnosis Date  . Frequent headaches   . Headache    2x week tension, monthly migraine  . 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 VAGINAL HYSTERECTOMY Left 2012   Rt ovary remains    Home Medications:  Allergies as of 01/29/2021      Reactions   Vistaril [hydroxyzine Hcl]       Medication List       Accurate as of January 29, 2021  1:25 PM. If you have any questions, ask your nurse or doctor.        buPROPion 300 MG 24 hr tablet Commonly known as: WELLBUTRIN XL Take 1 tablet (300 mg total) by mouth daily.   busPIRone 5 MG tablet Commonly known as: BUSPAR Take 1 tablet (5 mg total) by mouth at bedtime.   cloNIDine 0.1 MG tablet Commonly known as: CATAPRES Take 1-2 tablets (0.1-0.2 mg total) by mouth at bedtime as needed (anxiety).   fluconazole 150 MG tablet Commonly known as: DIFLUCAN Take one tablet by mouth on Day 1. Repeat dose 2nd tablet on Day 3.    FLUoxetine 20 MG capsule Commonly known as: PROZAC Take 3 capsules (60 mg total) by mouth daily.   nitrofurantoin (macrocrystal-monohydrate) 100 MG capsule Commonly known as: Macrobid Take 1 capsule (100 mg total) by mouth 2 (two) times daily. X 7 days       Allergies:  Allergies  Allergen Reactions  . Vistaril [Hydroxyzine Hcl]     Family History: Family History  Problem Relation Age of Onset  . Depression Mother   . Kidney cancer Maternal Grandmother   . Heart disease Maternal Grandmother   . Diabetes Maternal Grandfather   . Heart disease Paternal Grandmother   . Stroke Paternal Grandmother   . Breast cancer Neg Hx     Social History:  reports that she quit smoking about 2 years ago. She has a 1.25 pack-year smoking history. She has never used smokeless tobacco. She reports previous alcohol use. She reports previous drug use.   Physical Exam: There were no vitals taken for this visit.  Constitutional:  Alert and oriented, No acute distress. HEENT: [place] AT, moist mucus membranes.  Trachea midline, no masses. Cardiovascular: No clubbing, cyanosis, or edema. Respiratory: Normal respiratory effort, no increased work of breathing. GI: Abdomen is soft, nontender, nondistended, no abdominal masses GU: No CVA tenderness Lymph: No cervical or inguinal lymphadenopathy. Skin: No rashes, bruises or suspicious lesions. Neurologic: Grossly intact, no focal deficits, moving  all 4 extremities. Psychiatric: Normal mood and affect.  Laboratory Data: Lab Results  Component Value Date   WBC 5.3 04/16/2020   HGB 12.7 04/16/2020   HCT 38.1 04/16/2020   MCV 92.5 04/16/2020   PLT 380 04/16/2020    Lab Results  Component Value Date   CREATININE 1.00 04/16/2020    No results found for: PSA  No results found for: TESTOSTERONE  Lab Results  Component Value Date   HGBA1C 5.1 04/16/2020    Urinalysis    Component Value Date/Time   BILIRUBINUR small 12/18/2020 1201    PROTEINUR Positive (A) 12/18/2020 1201   UROBILINOGEN 0.2 12/18/2020 1201   NITRITE Negative 12/18/2020 1201   LEUKOCYTESUR Small (1+) (A) 12/18/2020 1201    No results found for: LABMICR, WBCUA, RBCUA, LABEPIT, MUCUS, BACTERIA  Pertinent Imaging: *** No results found for this or any previous visit.  No results found for this or any previous visit.  No results found for this or any previous visit.  No results found for this or any previous visit.  No results found for this or any previous visit.  No results found for this or any previous visit.  No results found for this or any previous visit.  No results found for this or any previous visit.   Assessment & Plan:    There are no diagnoses linked to this encounter.  No follow-ups on file.  Vanna Scotland, MD  [facility] [address], [address] [place], [place] [address] [phone]

## 2021-04-12 ENCOUNTER — Emergency Department: Payer: BC Managed Care – PPO

## 2021-04-12 ENCOUNTER — Emergency Department
Admission: EM | Admit: 2021-04-12 | Discharge: 2021-04-13 | Disposition: A | Discharge status: Home / Self Care | Payer: BC Managed Care – PPO | Attending: Emergency Medicine | Admitting: Emergency Medicine

## 2021-04-12 ENCOUNTER — Encounter: Payer: Self-pay | Admitting: Emergency Medicine

## 2021-04-12 ENCOUNTER — Other Ambulatory Visit: Payer: Self-pay

## 2021-04-12 DIAGNOSIS — R110 Nausea: Secondary | ICD-10-CM | POA: Diagnosis not present

## 2021-04-12 DIAGNOSIS — K219 Gastro-esophageal reflux disease without esophagitis: Secondary | ICD-10-CM | POA: Diagnosis not present

## 2021-04-12 DIAGNOSIS — R1110 Vomiting, unspecified: Secondary | ICD-10-CM | POA: Diagnosis not present

## 2021-04-12 DIAGNOSIS — R9431 Abnormal electrocardiogram [ECG] [EKG]: Secondary | ICD-10-CM | POA: Diagnosis not present

## 2021-04-12 DIAGNOSIS — Z87891 Personal history of nicotine dependence: Secondary | ICD-10-CM | POA: Insufficient documentation

## 2021-04-12 DIAGNOSIS — R1011 Right upper quadrant pain: Secondary | ICD-10-CM | POA: Diagnosis not present

## 2021-04-12 DIAGNOSIS — R112 Nausea with vomiting, unspecified: Secondary | ICD-10-CM | POA: Diagnosis not present

## 2021-04-12 LAB — CBC
HCT: 40.7 % (ref 36.0–46.0)
Hemoglobin: 13.6 g/dL (ref 12.0–15.0)
MCH: 30.1 pg (ref 26.0–34.0)
MCHC: 33.4 g/dL (ref 30.0–36.0)
MCV: 90 fL (ref 80.0–100.0)
Platelets: 407 10*3/uL — ABNORMAL HIGH (ref 150–400)
RBC: 4.52 MIL/uL (ref 3.87–5.11)
RDW: 12.2 % (ref 11.5–15.5)
WBC: 8.5 10*3/uL (ref 4.0–10.5)
nRBC: 0 % (ref 0.0–0.2)

## 2021-04-12 LAB — URINALYSIS, COMPLETE (UACMP) WITH MICROSCOPIC
Bacteria, UA: NONE SEEN
Bilirubin Urine: NEGATIVE
Glucose, UA: NEGATIVE mg/dL
Hgb urine dipstick: NEGATIVE
Ketones, ur: NEGATIVE mg/dL
Leukocytes,Ua: NEGATIVE
Nitrite: NEGATIVE
Protein, ur: NEGATIVE mg/dL
Specific Gravity, Urine: 1.002 — ABNORMAL LOW (ref 1.005–1.030)
WBC, UA: NONE SEEN WBC/hpf (ref 0–5)
pH: 8 (ref 5.0–8.0)

## 2021-04-12 LAB — BASIC METABOLIC PANEL
Anion gap: 9 (ref 5–15)
BUN: 15 mg/dL (ref 6–20)
CO2: 29 mmol/L (ref 22–32)
Calcium: 9.9 mg/dL (ref 8.9–10.3)
Chloride: 101 mmol/L (ref 98–111)
Creatinine, Ser: 1.13 mg/dL — ABNORMAL HIGH (ref 0.44–1.00)
GFR, Estimated: >60 mL/min (ref >60)
Glucose, Bld: 86 mg/dL (ref 70–99)
Potassium: 3.6 mmol/L (ref 3.5–5.1)
Sodium: 139 mmol/L (ref 135–145)

## 2021-04-12 LAB — POC URINE PREG, ED: Preg Test, Ur: NEGATIVE

## 2021-04-12 LAB — TROPONIN I (HIGH SENSITIVITY): Troponin I (High Sensitivity): <2 ng/L (ref <18)

## 2021-04-12 NOTE — ED Triage Notes (Signed)
Pt states that she has had right flank and abd pain with indigestion, and N/V that began today. Pt came in with a huge bottle of water and was drinking in it lobby.

## 2021-04-13 ENCOUNTER — Emergency Department: Payer: BC Managed Care – PPO

## 2021-04-13 DIAGNOSIS — R112 Nausea with vomiting, unspecified: Secondary | ICD-10-CM | POA: Diagnosis not present

## 2021-04-13 LAB — HEPATIC FUNCTION PANEL
ALT: 17 U/L (ref 0–44)
AST: 18 U/L (ref 15–41)
Albumin: 4.3 g/dL (ref 3.5–5.0)
Alkaline Phosphatase: 79 U/L (ref 38–126)
Bilirubin, Direct: <0.1 mg/dL (ref 0.0–0.2)
Total Bilirubin: 0.4 mg/dL (ref 0.3–1.2)
Total Protein: 7.4 g/dL (ref 6.5–8.1)

## 2021-04-13 LAB — TROPONIN I (HIGH SENSITIVITY): Troponin I (High Sensitivity): 2 ng/L (ref <18)

## 2021-04-13 LAB — LIPASE, BLOOD: Lipase: 32 U/L (ref 11–51)

## 2021-04-13 MED ORDER — SUCRALFATE 1 GM/10ML PO SUSP
1.0000 g | Freq: Once | ORAL | Status: AC
  Administered 2021-04-13: 1 g via ORAL
  Filled 2021-04-13: qty 10

## 2021-04-13 MED ORDER — ALUM & MAG HYDROXIDE-SIMETH 200-200-20 MG/5ML PO SUSP
30.0000 mL | Freq: Once | ORAL | Status: AC
  Administered 2021-04-13: 30 mL via ORAL
  Filled 2021-04-13: qty 30

## 2021-04-13 MED ORDER — LIDOCAINE VISCOUS HCL 2 % MT SOLN
15.0000 mL | Freq: Once | OROMUCOSAL | Status: AC
  Administered 2021-04-13: 15 mL via ORAL
  Filled 2021-04-13: qty 15

## 2021-04-13 MED ORDER — PANTOPRAZOLE SODIUM 40 MG PO TBEC: 40.0000 mg | DELAYED_RELEASE_TABLET | Freq: Every day | ORAL | 0 refills | Status: DC

## 2021-04-13 MED ORDER — KETOROLAC TROMETHAMINE 30 MG/ML IJ SOLN
30.0000 mg | Freq: Once | INTRAMUSCULAR | Status: AC
  Administered 2021-04-13: 30 mg via INTRAVENOUS
  Filled 2021-04-13: qty 1

## 2021-04-13 MED ORDER — ONDANSETRON 4 MG PO TBDP: 4.0000 mg | ORAL_TABLET | Freq: Four times a day (QID) | ORAL | 0 refills | Status: DC | PRN

## 2021-04-13 MED ORDER — DICYCLOMINE HCL 20 MG PO TABS: 20.0000 mg | ORAL_TABLET | Freq: Three times a day (TID) | ORAL | 0 refills | Status: DC | PRN

## 2021-04-13 MED ORDER — PANTOPRAZOLE SODIUM 40 MG IV SOLR
40.0000 mg | Freq: Once | INTRAVENOUS | Status: AC
  Administered 2021-04-13: 40 mg via INTRAVENOUS
  Filled 2021-04-13: qty 40

## 2021-04-13 MED ORDER — DICYCLOMINE HCL 10 MG/ML IM SOLN
20.0000 mg | Freq: Once | INTRAMUSCULAR | Status: AC
  Administered 2021-04-13: 20 mg via INTRAMUSCULAR
  Filled 2021-04-13 (×2): qty 2

## 2021-04-13 MED ORDER — ONDANSETRON HCL 4 MG/2ML IJ SOLN
4.0000 mg | Freq: Once | INTRAMUSCULAR | Status: AC
  Administered 2021-04-13: 4 mg via INTRAVENOUS
  Filled 2021-04-13: qty 2

## 2021-04-13 NOTE — Discharge Instructions (Signed)
Please avoid NSAIDs such as aspirin (Goody powders), ibuprofen (Motrin, Advil), naproxen (Aleve) as these may worsen your symptoms.  Tylenol 1000 mg every 6 hours is safe to take as long as you have no history of liver problems (heavy alcohol use, cirrhosis, hepatitis).  Please avoid spicy, acidic (citrus fruits, tomato based sauces, salsa), greasy, fatty foods.  Please avoid caffeine and alcohol.  Smoking can also make GERD/acid reflux worse.  Over the counter medications such as TUMS, Maalox or Mylanta, pepcid, Prilosec or Nexium may help with your symptoms.  Do not take Prilosec or Nexium if you are already prescribed a proton pump inhibitor.  

## 2021-04-13 NOTE — ED Provider Notes (Signed)
[facility] Emergency Department Provider Note  ____________________________________________   Event Date/Time   First MD Initiated Contact with Patient 04/12/21 2352     (approximate)  I have reviewed the triage vital signs and the nursing notes.   HISTORY  Chief Complaint Gastroesophageal Reflux, Flank Pain, Nausea, and Emesis    HPI Cheryl Smith is a 40 y.o. female with history of previous opioid abuse who presents to the emergency department complaints of right upper quadrant abdominal pain that radiates into the right back that started tonight.  Describes it as a discomfort with associated nausea, vomiting.  No diarrhea.  No dysuria, hematuria, vaginal bleeding or discharge.  No chest pain or shortness of breath.  Has had previous hysterectomy and left oophorectomy.  States that she ate crackers, grilled chicken and chicken nuggets today.  She is not aware of any aggravating or alleviating factors.  No known history of gallstones.  States she is having bad indigestion as well.        Past Medical History:  Diagnosis Date  . Frequent headaches   . Headache    2x week tension, monthly migraine  . Substance abuse (HCC)    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    Patient Active Problem List   Diagnosis Date Noted  . Pure hypercholesterolemia 04/16/2020  . Major depressive disorder, recurrent, moderate (HCC) 03/28/2019  . GAD (generalized anxiety disorder) 03/28/2019    Past Surgical History:  Procedure Laterality Date  . VAGINAL HYSTERECTOMY Left 2012   Rt ovary remains    Prior to Admission medications   Medication Sig Start Date End Date Taking? Authorizing Provider  dicyclomine (BENTYL) 20 MG tablet Take 1 tablet (20 mg total) by mouth every 8 (eight) hours as needed for spasms (Abdominal cramping). 04/13/21  Yes Joziyah Roblero, Baxter Hire N, DO  ondansetron (ZOFRAN ODT) 4 MG disintegrating tablet Take 1 tablet (4 mg total) by mouth every 6 (six) hours as needed  for nausea or vomiting. 04/13/21  Yes Farran Amsden, Baxter Hire N, DO  pantoprazole (PROTONIX) 40 MG tablet Take 1 tablet (40 mg total) by mouth daily. 04/13/21 04/13/22 Yes Lenda Baratta, Baxter Hire N, DO  buPROPion (WELLBUTRIN XL) 300 MG 24 hr tablet Take 1 tablet (300 mg total) by mouth daily. 12/18/20   Karamalegos, Netta Neat, DO  busPIRone (BUSPAR) 5 MG tablet Take 1 tablet (5 mg total) by mouth at bedtime. 12/18/20   Karamalegos, Netta Neat, DO  cloNIDine (CATAPRES) 0.1 MG tablet Take 1-2 tablets (0.1-0.2 mg total) by mouth at bedtime as needed (anxiety). 12/18/20   Karamalegos, Netta Neat, DO  fluconazole (DIFLUCAN) 150 MG tablet Take one tablet by mouth on Day 1. Repeat dose 2nd tablet on Day 3. 12/18/20   Althea Charon, Netta Neat, DO  FLUoxetine (PROZAC) 20 MG capsule Take 3 capsules (60 mg total) by mouth daily. 12/18/20   Karamalegos, Netta Neat, DO  nitrofurantoin, macrocrystal-monohydrate, (MACROBID) 100 MG capsule Take 1 capsule (100 mg total) by mouth 2 (two) times daily. X 7 days 12/20/20   Smitty Cords, DO    Allergies Vistaril [hydroxyzine hcl]  Family History  Problem Relation Age of Onset  . Depression Mother   . Kidney cancer Maternal Grandmother   . Heart disease Maternal Grandmother   . Diabetes Maternal Grandfather   . Heart disease Paternal Grandmother   . Stroke Paternal Grandmother   . Breast cancer Neg Hx     Social History Social History   Tobacco Use  . Smoking status: Former Smoker  Packs/day: 0.25    Years: 5.00    Pack years: 1.25    Quit date: 12/01/2018    Years since quitting: 2.3  . Smokeless tobacco: Never Used  Vaping Use  . Vaping Use: Never used  Substance Use Topics  . Alcohol use: Not Currently  . Drug use: Not Currently    Review of Systems Constitutional: No fever. Eyes: No visual changes. ENT: No sore throat. Cardiovascular: Denies chest pain. Respiratory: Denies shortness of breath. Gastrointestinal: + nausea, vomiting.  No  diarrhea. Genitourinary: Negative for dysuria. Musculoskeletal: Negative for back pain. Skin: Negative for rash. Neurological: Negative for focal weakness or numbness.  ____________________________________________   PHYSICAL EXAM:  VITAL SIGNS: ED Triage Vitals  Enc Vitals Group     BP 04/12/21 2206 (!) 125/96     Pulse Rate 04/12/21 2206 89     Resp 04/12/21 2206 18     Temp 04/12/21 2206 98.3 F (36.8 C)     Temp Source 04/12/21 2206 Oral     SpO2 04/12/21 2206 99 %     Weight 04/12/21 2207 140 lb (63.5 kg)     Height 04/12/21 2207 5' (1.524 m)     Head Circumference --      Peak Flow --      Pain Score 04/12/21 2207 8     Pain Loc --      Pain Edu? --      Excl. in GC? --    CONSTITUTIONAL: Alert and oriented and responds appropriately to questions. Well-appearing; well-nourished HEAD: Normocephalic EYES: Conjunctivae clear, pupils appear equal, EOM appear intact ENT: normal nose; moist mucous membranes NECK: Supple, normal ROM CARD: RRR; S1 and S2 appreciated; no murmurs, no clicks, no rubs, no gallops RESP: Normal chest excursion without splinting or tachypnea; breath sounds clear and equal bilaterally; no wheezes, no rhonchi, no rales, no hypoxia or respiratory distress, speaking full sentences ABD/GI: Normal bowel sounds; non-distended; soft, tender to palpation in the right upper quadrant, negative Murphy sign, no tenderness at McBurney's point BACK: The back appears normal EXT: Normal ROM in all joints; no deformity noted, no edema; no cyanosis SKIN: Normal color for age and race; warm; no rash on exposed skin NEURO: Moves all extremities equally PSYCH: The patient's mood and manner are appropriate.  ____________________________________________   LABS (all labs ordered are listed, but only abnormal results are displayed)  Labs Reviewed  BASIC METABOLIC PANEL - Abnormal; Notable for the following components:      Result Value   Creatinine, Ser 1.13 (*)     All other components within normal limits  CBC - Abnormal; Notable for the following components:   Platelets 407 (*)    All other components within normal limits  URINALYSIS, COMPLETE (UACMP) WITH MICROSCOPIC - Abnormal; Notable for the following components:   Color, Urine COLORLESS (*)    APPearance CLEAR (*)    Specific Gravity, Urine 1.002 (*)    All other components within normal limits  HEPATIC FUNCTION PANEL  LIPASE, BLOOD  POC URINE PREG, ED  POC URINE PREG, ED  TROPONIN I (HIGH SENSITIVITY)  TROPONIN I (HIGH SENSITIVITY)   ____________________________________________  EKG   EKG Interpretation  Date/Time:  Friday Apr 12 2021 22:10:05 EDT Ventricular Rate:  83 PR Interval:  144 QRS Duration: 80 QT Interval:  380 QTC Calculation: 446 R Axis:   73 Text Interpretation: Normal sinus rhythm Normal ECG Confirmed by Adin Lariccia, Baxter HireKristen [phone]([id number]) on 04/13/2021 12:10:42 AM  ____________________________________________  RADIOLOGY Normajean Baxter Shalina Norfolk, personally viewed and evaluated these images (plain radiographs) as part of my medical decision making, as well as reviewing the written report by the radiologist.  ED MD interpretation: Right upper quad ultrasound unremarkable.  Chest x-ray clear.  Official radiology report(s): DG Chest 2 View  Result Date: 04/12/2021 CLINICAL DATA:  Right-sided pain.  Nausea and vomiting. EXAM: CHEST - 2 VIEW COMPARISON:  None. FINDINGS: The cardiomediastinal contours are normal. The lungs are clear. Pulmonary vasculature is normal. No consolidation, pleural effusion, or pneumothorax. No acute osseous abnormalities are seen. IMPRESSION: Negative radiographs of the chest. Electronically Signed   By: Narda Rutherford M.D.   On: 04/12/2021 22:36   US Abdomen Limited RUQ (LIVER/GB)  Result Date: 04/13/2021 CLINICAL DATA:  Right-sided pain with nausea and vomiting EXAM: ULTRASOUND ABDOMEN LIMITED RIGHT UPPER QUADRANT COMPARISON:  None. FINDINGS:  Gallbladder: Gallbladder is contracted. No gallstones or wall thickening visualized. No sonographic Murphy sign noted by sonographer. Common bile duct: Diameter: 5 mm. Liver: No focal lesion identified. Within normal limits in parenchymal echogenicity. Portal vein is patent on color Doppler imaging with normal direction of blood flow towards the liver. Other: None. IMPRESSION: Unremarkable right upper quadrant ultrasound. Electronically Signed   By: Maudry Mayhew MD   On: 04/13/2021 01:13    ____________________________________________   PROCEDURES  Procedure(s) performed (including Critical Care):  Procedures   ____________________________________________   INITIAL IMPRESSION / ASSESSMENT AND PLAN / ED COURSE  As part of my medical decision making, I reviewed the following data within the electronic MEDICAL RECORD NUMBER Nursing notes reviewed and incorporated, Labs reviewed , EKG interpreted , Radiograph reviewed  and Notes from prior ED visits         Patient here with complaints of right upper quadrant abdominal pain.  Lab work obtained in triage is reassuring with no leukocytosis, normal troponin.  EKG shows no ischemia.  We will add on LFTs, lipase as high as I am concerned that this could be biliary in nature.  Will obtain right upper quadrant ultrasound.  No tenderness at McBurney's point.  Doubt appendicitis.  Differential includes cholelithiasis, cholecystitis, pancreatitis, cholangitis, gastritis, GERD.  Will give Toradol, Zofran, Protonix for symptomatic relief.  ED PROGRESS  Patient's LFTs, lipase normal.  Right upper quad ultrasound shows no acute abnormality.  Reports only minimal improvement after Protonix, GI cocktail.  Will give Bentyl, Carafate and reassess.  Currently tolerating p.o. here.  3:15 AM  Pt reports feeling better.  No further vomiting here.  Suspect GERD, gastritis contributing to her symptoms.  Will discharge with Protonix, Bentyl, Zofran and given GI  follow-up if symptoms do not improve.  I recommended diet changes.  At this time, I do not feel there is any life-threatening condition present. I have reviewed, interpreted and discussed all results (EKG, imaging, lab, urine as appropriate) and exam findings with patient/family. I have reviewed nursing notes and appropriate previous records.  I feel the patient is safe to be discharged home without further emergent workup and can continue workup as an outpatient as needed. Discussed usual and customary return precautions. Patient/family verbalize understanding and are comfortable with this plan.  Outpatient follow-up has been provided as needed. All questions have been answered.  ____________________________________________   FINAL CLINICAL IMPRESSION(S) / ED DIAGNOSES  Final diagnoses:  RUQ abdominal pain  Gastroesophageal reflux disease, unspecified whether esophagitis present     ED Discharge Orders         Ordered  dicyclomine (BENTYL) 20 MG tablet  Every 8 hours PRN        04/13/21 0321    ondansetron (ZOFRAN ODT) 4 MG disintegrating tablet  Every 6 hours PRN        04/13/21 0321    pantoprazole (PROTONIX) 40 MG tablet  Daily        04/13/21 0321          *Please note:  Cheryl Smith was evaluated in Emergency Department on 04/13/2021 for the symptoms described in the history of present illness. She was evaluated in the context of the global COVID-19 pandemic, which necessitated consideration that the patient might be at risk for infection with the SARS-CoV-2 virus that causes COVID-19. Institutional protocols and algorithms that pertain to the evaluation of patients at risk for COVID-19 are in a state of rapid change based on information released by regulatory bodies including the CDC and federal and state organizations. These policies and algorithms were followed during the patient's care in the ED.  Some ED evaluations and interventions may be delayed as a result of limited  staffing during and the pandemic.*   Note:  This document was prepared using Dragon voice recognition software and may include unintentional dictation errors.   Adorian Gwynne, Layla Maw, DO 04/13/21 [phone]

## 2021-04-17 ENCOUNTER — Other Ambulatory Visit: Payer: Self-pay

## 2021-04-17 ENCOUNTER — Ambulatory Visit (INDEPENDENT_AMBULATORY_CARE_PROVIDER_SITE_OTHER): Payer: BC Managed Care – PPO | Admitting: Obstetrics and Gynecology

## 2021-04-17 ENCOUNTER — Encounter: Payer: Self-pay | Admitting: Obstetrics and Gynecology

## 2021-06-11 ENCOUNTER — Other Ambulatory Visit: Payer: Self-pay

## 2021-06-11 DIAGNOSIS — E782 Mixed hyperlipidemia: Secondary | ICD-10-CM

## 2021-06-11 DIAGNOSIS — E559 Vitamin D deficiency, unspecified: Secondary | ICD-10-CM

## 2021-06-11 DIAGNOSIS — R7309 Other abnormal glucose: Secondary | ICD-10-CM

## 2021-06-11 DIAGNOSIS — Z0000 Encounter for general adult medical examination without abnormal findings: Secondary | ICD-10-CM

## 2021-06-15 ENCOUNTER — Other Ambulatory Visit: Payer: Self-pay | Admitting: Family Medicine

## 2021-06-15 DIAGNOSIS — F411 Generalized anxiety disorder: Secondary | ICD-10-CM

## 2021-06-15 DIAGNOSIS — F331 Major depressive disorder, recurrent, moderate: Secondary | ICD-10-CM

## 2021-06-15 NOTE — Telephone Encounter (Signed)
Requested Prescriptions  Pending Prescriptions Disp Refills  . FLUoxetine (PROZAC) 20 MG capsule [Pharmacy Med Name: FLUOXETINE HCL 20 MG CAPSULE] 90 capsule 0    Sig: TAKE 3 CAPSULES BY MOUTH EVERY DAY     Psychiatry:  Antidepressants - SSRI Passed - 06/15/2021 10:36 AM      Passed - Completed PHQ-2 or PHQ-9 in the last 360 days      Passed - Valid encounter within last 6 months    Recent Outpatient Visits          5 months ago Major depressive disorder, recurrent, moderate (HCC)   [facility] Althea Charon, Netta Neat, DO   1 year ago Annual physical exam   [facility] Smitty Cords, DO   1 year ago Acute non-recurrent maxillary sinusitis   [facility], Netta Neat, DO   2 years ago Major depressive disorder with single episode, in partial remission [facility])   [facility] Althea Charon, Netta Neat, DO   2 years ago Encounter for annual physical exam   [facility] Kyung Rudd, Alison Stalling, NP      Future Appointments            In 3 days Althea Charon, Netta Neat, DO [facility]

## 2021-06-18 ENCOUNTER — Encounter: Payer: Self-pay | Admitting: Family Medicine

## 2021-07-10 ENCOUNTER — Other Ambulatory Visit: Payer: Self-pay | Admitting: Family Medicine

## 2021-07-10 DIAGNOSIS — F411 Generalized anxiety disorder: Secondary | ICD-10-CM

## 2021-07-10 DIAGNOSIS — F331 Major depressive disorder, recurrent, moderate: Secondary | ICD-10-CM

## 2021-07-10 NOTE — Telephone Encounter (Signed)
  Notes to clinic:  REQUEST FOR 90 DAYS PRESCRIPTION. DX Code Needed.   Requested Prescriptions  Pending Prescriptions Disp Refills   FLUoxetine (PROZAC) 20 MG capsule [Pharmacy Med Name: FLUOXETINE HCL 20 MG CAPSULE] 270 capsule 1    Sig: TAKE 3 CAPSULES BY MOUTH EVERY DAY      Psychiatry:  Antidepressants - SSRI Failed - 07/10/2021  1:34 PM      Failed - Valid encounter within last 6 months    Recent Outpatient Visits           6 months ago Major depressive disorder, recurrent, moderate (HCC)   [facility] Althea Charon, Netta Neat, DO   1 year ago Annual physical exam   [facility] Smitty Cords, DO   1 year ago Acute non-recurrent maxillary sinusitis   [facility] [place], Netta Neat, DO   2 years ago Major depressive disorder with single episode, in partial remission [facility])   [facility] Althea Charon, Netta Neat, DO   2 years ago Encounter for annual physical exam   [facility] Kyung Rudd, Alison Stalling, NP                Passed - Completed PHQ-2 or PHQ-9 in the last 360 days

## 2021-07-25 ENCOUNTER — Telehealth: Payer: Self-pay | Admitting: Family Medicine

## 2021-07-25 ENCOUNTER — Other Ambulatory Visit: Payer: Self-pay | Admitting: Family Medicine

## 2021-07-25 DIAGNOSIS — F411 Generalized anxiety disorder: Secondary | ICD-10-CM

## 2021-07-25 DIAGNOSIS — F331 Major depressive disorder, recurrent, moderate: Secondary | ICD-10-CM

## 2021-07-25 NOTE — Telephone Encounter (Signed)
Requested medication (s) are due for refill today: yes  Requested medication (s) are on the active medication list:  yes  Last refill:  05/02/2021  Future visit scheduled: no  Notes to clinic:  Overdue for 6 month follow up  Message has been sent to patient to contact office    Requested Prescriptions  Pending Prescriptions Disp Refills   buPROPion (WELLBUTRIN XL) 300 MG 24 hr tablet [Pharmacy Med Name: BUPROPION HCL XL 300 MG TABLET] 90 tablet 1    Sig: TAKE 1 TABLET BY MOUTH EVERY DAY     Psychiatry: Antidepressants - bupropion Failed - 07/25/2021  8:28 AM      Failed - Valid encounter within last 6 months    Recent Outpatient Visits           7 months ago Major depressive disorder, recurrent, moderate (HCC)   [facility] Althea Charon, Netta Neat, DO   1 year ago Annual physical exam   [facility] Smitty Cords, DO   1 year ago Acute non-recurrent maxillary sinusitis   [facility] [place], Netta Neat, DO   2 years ago Major depressive disorder with single episode, in partial remission [facility])   [facility] Althea Charon, Netta Neat, DO   2 years ago Encounter for annual physical exam   [facility] Kyung Rudd, Alison Stalling, NP              Passed - Completed PHQ-2 or PHQ-9 in the last 360 days      Passed - Last BP in normal range    BP Readings from Last 1 Encounters:  04/17/21 120/80

## 2021-07-26 NOTE — Telephone Encounter (Signed)
Requested medication (s) are due for refill today:   Yes  Requested medication (s) are on the active medication list:   Yes  Future visit scheduled:   No  Been notified via MyChart to call in for an appt.   She received and read her message on 07/25/2021   Last ordered: 12/18/2020 #90, 1 refill  Returned because pt needs an appt.   Been notified   Requested Prescriptions  Pending Prescriptions Disp Refills   buPROPion (WELLBUTRIN XL) 300 MG 24 hr tablet [Pharmacy Med Name: BUPROPION HCL XL 300 MG TABLET] 90 tablet 1    Sig: TAKE 1 TABLET BY MOUTH EVERY DAY     Psychiatry: Antidepressants - bupropion Failed - 07/25/2021  7:07 PM      Failed - Valid encounter within last 6 months    Recent Outpatient Visits           7 months ago Major depressive disorder, recurrent, moderate (HCC)   [facility], Netta Neat, DO   1 year ago Annual physical exam   [facility] Smitty Cords, DO   1 year ago Acute non-recurrent maxillary sinusitis   [facility] [place], Netta Neat, DO   2 years ago Major depressive disorder with single episode, in partial remission [facility])   [facility] Althea Charon, Netta Neat, DO   2 years ago Encounter for annual physical exam   [facility] Kyung Rudd, Alison Stalling, NP              Passed - Completed PHQ-2 or PHQ-9 in the last 360 days      Passed - Last BP in normal range    BP Readings from Last 1 Encounters:  04/17/21 120/80

## 2021-08-02 ENCOUNTER — Other Ambulatory Visit: Payer: Self-pay | Admitting: Family Medicine

## 2021-08-02 DIAGNOSIS — F411 Generalized anxiety disorder: Secondary | ICD-10-CM

## 2021-08-02 DIAGNOSIS — F331 Major depressive disorder, recurrent, moderate: Secondary | ICD-10-CM

## 2021-08-02 NOTE — Telephone Encounter (Signed)
Requested medications are due for refill today NO   Buspar  Requested medications are on the active medication list yes  Last refill 6/2 for 90 day supply  Last visit 12/2020  Future visit scheduled no  Notes to clinic requesting early, no upcoming appt please assess

## 2021-08-06 MED ORDER — BUSPIRONE HCL 5 MG PO TABS: 5.0000 mg | ORAL_TABLET | Freq: Every day | ORAL | 1 refills | Status: DC

## 2021-08-06 MED ORDER — BUPROPION HCL ER (XL) 300 MG PO TB24: 300.0000 mg | ORAL_TABLET | Freq: Every day | ORAL | 1 refills | Status: DC

## 2021-08-06 NOTE — Telephone Encounter (Signed)
This pt is calling in wanting to make an appt to get this    buPROPion (WELLBUTRIN XL) 300 MG 24 hr tablet [Pharmacy Med Name: BUPROPION HCL XL 300 MG TABLET] 90 tablet 1      Sig: TAKE 1 TABLET BY MOUTH EVERY DAY  She was trying to make an appt but nothing is available until the 19th. States she is out of med and when she goes off it things do not end well. Requesting an appt before the 19th as she will be in withdrawal on her meds. FU as office is at lunch PT CB is [phone] [id number] - [place], [place] - [address]  [address] [place] [address]  Phone: [phone] Fax: [phone]  Hours: Not open 24 hours

## 2021-08-06 NOTE — Addendum Note (Signed)
Addended by: Smitty Cords on: 08/06/2021 02:50 PM   Modules accepted: Orders

## 2021-08-06 NOTE — Telephone Encounter (Signed)
The reason for denial on medication is because the appointment was not scheduled  No worries, if she cannot be seen until the 19th, that is fine, I have ordered meds now but she needs to schedule the appointment.  Thank you  Saralyn Pilar, DO [facility] 08/06/2021, 2:50 PM

## 2021-08-12 ENCOUNTER — Telehealth: Payer: BC Managed Care – PPO | Admitting: Family Medicine

## 2021-08-28 ENCOUNTER — Telehealth: Payer: BC Managed Care – PPO | Admitting: Family Medicine

## 2021-09-12 ENCOUNTER — Other Ambulatory Visit: Payer: Self-pay | Admitting: Family Medicine

## 2021-09-12 DIAGNOSIS — F411 Generalized anxiety disorder: Secondary | ICD-10-CM

## 2021-09-12 NOTE — Telephone Encounter (Signed)
Requested medications are due for refill today.  yes  Requested medications are on the active medications list.  yes  Last refill. 12/18/2020  Future visit scheduled.   no  Notes to clinic.  Patient is more than 3 months overdue for an office visit.

## 2022-01-11 ENCOUNTER — Other Ambulatory Visit: Payer: Self-pay | Admitting: Family Medicine

## 2022-01-11 DIAGNOSIS — F411 Generalized anxiety disorder: Secondary | ICD-10-CM

## 2022-01-11 DIAGNOSIS — F331 Major depressive disorder, recurrent, moderate: Secondary | ICD-10-CM

## 2022-01-13 ENCOUNTER — Other Ambulatory Visit: Payer: Self-pay | Admitting: Family Medicine

## 2022-01-13 DIAGNOSIS — F331 Major depressive disorder, recurrent, moderate: Secondary | ICD-10-CM

## 2022-01-13 NOTE — Telephone Encounter (Signed)
Requested medications are due for refill today.  yes  Requested medications are on the active medications list.  yes  Last refill. 07/10/2021 #270 1 refill  Future visit scheduled.   no  Notes to clinic.  Pt is more than 3 months overdue for an OV. Pt last seen 12/18/2020. Pt has missed several appointments.    Requested Prescriptions  Pending Prescriptions Disp Refills   FLUoxetine (PROZAC) 20 MG capsule [Pharmacy Med Name: FLUOXETINE HCL 20 MG CAPSULE] 270 capsule 1    Sig: TAKE 3 CAPSULES BY MOUTH EVERY DAY     Psychiatry:  Antidepressants - SSRI Failed - 01/11/2022 10:06 AM      Failed - Completed PHQ-2 or PHQ-9 in the last 360 days      Failed - Valid encounter within last 6 months    Recent Outpatient Visits           1 year ago Major depressive disorder, recurrent, moderate ([place])   [place], DO   1 year ago Annual physical exam   [facility] Olin Hauser, DO   2 years ago Acute non-recurrent maxillary sinusitis   [place], DO   2 years ago Major depressive disorder with single episode, in partial remission [facility])   [facility] Olin Hauser, DO   3 years ago Encounter for annual physical exam   [facility] Merrilyn Puma, Jerrel Ivory, NP

## 2022-01-14 NOTE — Telephone Encounter (Signed)
Requested medication (s) are due for refill today: Yes  Requested medication (s) are on the active medication list: Yes  Last refill:  08/07/21  Future visit scheduled: No  Notes to clinic:  Unable to refill per protocol, appointment needed.      Requested Prescriptions  Pending Prescriptions Disp Refills   buPROPion (WELLBUTRIN XL) 300 MG 24 hr tablet [Pharmacy Med Name: BUPROPION HCL XL 300 MG TABLET] 90 tablet 1    Sig: TAKE 1 TABLET BY MOUTH EVERY DAY     Psychiatry: Antidepressants - bupropion Failed - 01/13/2022  1:18 PM      Failed - Cr in normal range and within 360 days    Creat  Date Value Ref Range Status  04/16/2020 1.00 0.50 - 1.10 mg/dL Final   Creatinine, Ser  Date Value Ref Range Status  04/12/2021 1.13 (H) 0.44 - 1.00 mg/dL Final          Failed - Completed PHQ-2 or PHQ-9 in the last 360 days      Failed - Valid encounter within last 6 months    Recent Outpatient Visits           1 year ago Major depressive disorder, recurrent, moderate ([place])   [place], DO   1 year ago Annual physical exam   [facility] Olin Hauser, DO   2 years ago Acute non-recurrent maxillary sinusitis   [place], DO   2 years ago Major depressive disorder with single episode, in partial remission [facility])   [facility], Devonne Doughty, DO   3 years ago Encounter for annual physical exam   [facility] Merrilyn Puma, Jerrel Ivory, NP              Passed - AST in normal range and within 360 days    AST  Date Value Ref Range Status  04/12/2021 18 15 - 41 U/L Final          Passed - ALT in normal range and within 360 days    ALT  Date Value Ref Range Status  04/12/2021 17 0 - 44 U/L Final          Passed - Last BP in normal range    BP Readings from Last 1 Encounters:  04/17/21 120/80

## 2022-01-14 NOTE — Telephone Encounter (Signed)
Patient called, left VM to return the call to the office to schedule an OV. Will need OV in order to receive refills, last OV 12/18/20.

## 2022-01-16 ENCOUNTER — Encounter: Payer: Self-pay | Admitting: Family Medicine

## 2022-01-16 ENCOUNTER — Ambulatory Visit: Payer: BC Managed Care – PPO | Admitting: Family Medicine

## 2022-01-16 ENCOUNTER — Other Ambulatory Visit: Payer: Self-pay

## 2022-01-16 ENCOUNTER — Other Ambulatory Visit: Payer: Self-pay | Admitting: Family Medicine

## 2022-01-16 DIAGNOSIS — F411 Generalized anxiety disorder: Secondary | ICD-10-CM

## 2022-01-16 DIAGNOSIS — F331 Major depressive disorder, recurrent, moderate: Secondary | ICD-10-CM | POA: Diagnosis not present

## 2022-01-16 DIAGNOSIS — E782 Mixed hyperlipidemia: Secondary | ICD-10-CM

## 2022-01-16 DIAGNOSIS — R7309 Other abnormal glucose: Secondary | ICD-10-CM

## 2022-01-16 DIAGNOSIS — E559 Vitamin D deficiency, unspecified: Secondary | ICD-10-CM

## 2022-01-16 DIAGNOSIS — Z0000 Encounter for general adult medical examination without abnormal findings: Secondary | ICD-10-CM

## 2022-01-16 MED ORDER — FLUOXETINE HCL 20 MG PO CAPS: 60.0000 mg | ORAL_CAPSULE | Freq: Every day | ORAL | 3 refills | Status: DC

## 2022-01-16 MED ORDER — BUSPIRONE HCL 5 MG PO TABS: 5.0000 mg | ORAL_TABLET | Freq: Every evening | ORAL | 3 refills | Status: DC | PRN

## 2022-01-16 MED ORDER — BUPROPION HCL ER (XL) 300 MG PO TB24: 300.0000 mg | ORAL_TABLET | Freq: Every day | ORAL | 3 refills | Status: DC

## 2022-01-16 NOTE — Progress Notes (Signed)
Subjective:    Patient ID: Cheryl Smith, female    DOB: Oct 09, 1981, 41 y.o.   MRN: [id number]  Cheryl Smith is a 41 y.o. female presenting on 01/16/2022 for Depression   HPI  Major Depression, moderate recurrent / Anxiety Known chronic anxiety - but has had worse depression in past >3 years based off of life stressors. Significant family stressor with daughter history of drug use - has improved now.  Overall improved on current medications. 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 She was in treatment center in [place] in past. She states cannot take anything addictive such as benzo. Cannot take Benadryl  Admits episodes of difficulty sleeping can be weeks at a time then can improve.   Currently following - [facility] Taking Fluoxetine 20mg  x 3 = 60mg  daily Taking Bupropion XL 300mg  daily Taking Buspar 5mg  nightly at bedtime for anxiety - lately no longer taking Buspar but was helpful. Taking Clonidine 0.1, half, one or two as needed for insomnia - sometimes takes up to 2 pills, works well. Takes this Rarely only PRN Sleep/Insomnia   Depression screen [facility] 2/9 01/16/2022 12/18/2020 04/16/2020  Decreased Interest 1 1 2   Down, Depressed, Hopeless 1 1 3   PHQ - 2 Score 2 2 5   Altered sleeping 2 2 3   Tired, decreased energy 2 3 3   Change in appetite 2 3 2   Feeling bad or failure about yourself  1 1 2   Trouble concentrating 1 0 2  Moving slowly or fidgety/restless 0 0 0  Suicidal thoughts 0 0 0  PHQ-9 Score 10 11 17   Difficult doing work/chores Somewhat difficult Somewhat difficult Extremely dIfficult   GAD 7 : Generalized Anxiety Score 01/16/2022 12/18/2020 04/16/2020 11/02/2019  Nervous, Anxious, on Edge 1 2 3 2   Control/stop worrying 1 3 3 2   Worry too much - different things 1 3 3 2   Trouble relaxing 1 3 3 2   Restless 1 0 2 2  Easily annoyed or irritable 1 1 2 2   Afraid - awful might happen 0 1 3 1    Total GAD 7 Score 6 13 19 13   Anxiety Difficulty Somewhat difficult Somewhat difficult Extremely difficult Very difficult      Social History   Tobacco Use   Smoking status: Former    Packs/day: 0.25    Years: 5.00    Pack years: 1.25    Types: Cigarettes    Quit date: 12/01/2018    Years since quitting: 3.1   Smokeless tobacco: Never  Vaping Use   Vaping Use: Never used  Substance Use Topics   Alcohol use: Not Currently   Drug use: Not Currently    Review of Systems Per HPI unless specifically indicated above     Objective:    BP 102/68    Pulse 91    Ht 5' (1.524 m)    Wt 145 lb 3.2 oz (65.9 kg)    SpO2 99%    BMI 28.36 kg/m   Wt Readings from Last 3 Encounters:  01/16/22 145 lb 3.2 oz (65.9 kg)  04/17/21 146 lb (66.2 kg)  04/12/21 140 lb (63.5 kg)    Physical Exam Vitals and nursing note reviewed.  Constitutional:      General: She is not in acute distress.    Appearance: Normal appearance. She is well-developed. She is not diaphoretic.     Comments: Well-appearing, comfortable, cooperative  HENT:  Head: Normocephalic and atraumatic.  Eyes:     General:        Right eye: No discharge.        Left eye: No discharge.     Conjunctiva/sclera: Conjunctivae normal.  Cardiovascular:     Rate and Rhythm: Normal rate.  Pulmonary:     Effort: Pulmonary effort is normal.  Skin:    General: Skin is warm and dry.     Findings: No erythema or rash.  Neurological:     Mental Status: She is alert and oriented to person, place, and time.  Psychiatric:        Mood and Affect: Mood normal.        Behavior: Behavior normal.        Thought Content: Thought content normal.     Comments: Well groomed, good eye contact, normal speech and thoughts   Results for orders placed or performed during the hospital encounter of 04/12/21  Basic metabolic panel  Result Value Ref Range   Sodium 139 135 - 145 mmol/L   Potassium 3.6 3.5 - 5.1 mmol/L   Chloride 101 98 - 111  mmol/L   CO2 29 22 - 32 mmol/L   Glucose, Bld 86 70 - 99 mg/dL   BUN 15 6 - 20 mg/dL   Creatinine, Ser 4.62 (H) 0.44 - 1.00 mg/dL   Calcium 9.9 8.9 - 70.3 mg/dL   GFR, Estimated >50 >09 mL/min   Anion gap 9 5 - 15  CBC  Result Value Ref Range   WBC 8.5 4.0 - 10.5 K/uL   RBC 4.52 3.87 - 5.11 MIL/uL   Hemoglobin 13.6 12.0 - 15.0 g/dL   HCT 38.1 82.9 - 93.7 %   MCV 90.0 80.0 - 100.0 fL   MCH 30.1 26.0 - 34.0 pg   MCHC 33.4 30.0 - 36.0 g/dL   RDW 16.9 67.8 - 93.8 %   Platelets 407 (H) 150 - 400 K/uL   nRBC 0.0 0.0 - 0.2 %  Urinalysis, Complete w Microscopic Urine, Clean Catch  Result Value Ref Range   Color, Urine COLORLESS (A) YELLOW   APPearance CLEAR (A) CLEAR   Specific Gravity, Urine 1.002 (L) 1.005 - 1.030   pH 8.0 5.0 - 8.0   Glucose, UA NEGATIVE NEGATIVE mg/dL   Hgb urine dipstick NEGATIVE NEGATIVE   Bilirubin Urine NEGATIVE NEGATIVE   Ketones, ur NEGATIVE NEGATIVE mg/dL   Protein, ur NEGATIVE NEGATIVE mg/dL   Nitrite NEGATIVE NEGATIVE   Leukocytes,Ua NEGATIVE NEGATIVE   WBC, UA NONE SEEN 0 - 5 WBC/hpf   Bacteria, UA NONE SEEN NONE SEEN   Squamous Epithelial / LPF 0-5 0 - 5  Hepatic function panel  Result Value Ref Range   Total Protein 7.4 6.5 - 8.1 g/dL   Albumin 4.3 3.5 - 5.0 g/dL   AST 18 15 - 41 U/L   ALT 17 0 - 44 U/L   Alkaline Phosphatase 79 38 - 126 U/L   Total Bilirubin 0.4 0.3 - 1.2 mg/dL   Bilirubin, Direct <1.0 0.0 - 0.2 mg/dL   Indirect Bilirubin NOT CALCULATED 0.3 - 0.9 mg/dL  Lipase, blood  Result Value Ref Range   Lipase 32 11 - 51 U/L  POC urine preg, ED  Result Value Ref Range   Preg Test, Ur NEGATIVE NEGATIVE  Troponin I (High Sensitivity)  Result Value Ref Range   Troponin I (High Sensitivity) <2 <18 ng/L  Troponin I (High Sensitivity)  Result Value Ref Range  Troponin I (High Sensitivity) 2 <18 ng/L      Assessment & Plan:   Problem List Items Addressed This Visit     Major depressive disorder, recurrent, moderate (HCC)    Relevant Medications   busPIRone (BUSPAR) 5 MG tablet   FLUoxetine (PROZAC) 20 MG capsule   buPROPion (WELLBUTRIN XL) 300 MG 24 hr tablet   GAD (generalized anxiety disorder)   Relevant Medications   busPIRone (BUSPAR) 5 MG tablet   FLUoxetine (PROZAC) 20 MG capsule   buPROPion (WELLBUTRIN XL) 300 MG 24 hr tablet    GAD Major Depression recurrent moderate Overall improved on medication management Continues on Buspar 5mg  QHS most nights Continue Wellbutrin XL 300mg  daily Continue Fluoxetine 60mg  (20mg  x 3) Refills sent   Meds ordered this encounter  Medications   busPIRone (BUSPAR) 5 MG tablet    Sig: Take 1 tablet (5 mg total) by mouth at bedtime as needed.    Dispense:  90 tablet    Refill:  3    F41.8   FLUoxetine (PROZAC) 20 MG capsule    Sig: Take 3 capsules (60 mg total) by mouth daily.    Dispense:  270 capsule    Refill:  3    F33.1   buPROPion (WELLBUTRIN XL) 300 MG 24 hr tablet    Sig: Take 1 tablet (300 mg total) by mouth daily.    Dispense:  90 tablet    Refill:  3    Add refills on file      Follow up plan: Return in about 3 months (around 04/15/2022) for 3 month fasting lab only then 1 week later Annual Physical.  Future labs ordered for  CMET CBC LIPID A1c TSH Vit D  Saralyn Pilar, DO [facility] 01/16/2022, 8:49 AM

## 2022-01-16 NOTE — Patient Instructions (Addendum)
Thank you for coming to the office today.  Refilled Fluoxetine, Buspar, Wellbutrin for 90 day with 3 + refills  DUE for FASTING BLOOD WORK (no food or drink after midnight before the lab appointment, only water or coffee without cream/sugar on the morning of)  SCHEDULE "Lab Only" visit in the morning at the clinic for lab draw in 3 MONTHS   - Make sure Lab Only appointment is at about 1 week before your next appointment, so that results will be available  For Lab Results, once available within 2-3 days of blood draw, you can can log in to MyChart online to view your results and a brief explanation. Also, we can discuss results at next follow-up visit.   Please schedule a Follow-up Appointment to: Return in about 3 months (around 04/15/2022) for 3 month fasting lab only then 1 week later Annual Physical.  If you have any other questions or concerns, please feel free to call the office or send a message through MyChart. You may also schedule an earlier appointment if necessary.  Additionally, you may be receiving a survey about your experience at our office within a few days to 1 week by e-mail or mail. We value your feedback.  Saralyn Pilar, DO [facility], [place]

## 2022-04-22 ENCOUNTER — Encounter: Payer: BC Managed Care – PPO | Admitting: Family Medicine

## 2022-05-02 ENCOUNTER — Encounter: Payer: BC Managed Care – PPO | Admitting: Family Medicine

## 2022-09-13 DIAGNOSIS — G43909 Migraine, unspecified, not intractable, without status migrainosus: Secondary | ICD-10-CM | POA: Diagnosis not present

## 2022-11-08 IMAGING — US US ABDOMEN LIMITED RUQ/ASCITES
1 series · 14 of 25 positions shown · non-contrast
Comparison: None.

CLINICAL DATA: Right-sided pain with nausea and vomiting

EXAM:
ULTRASOUND ABDOMEN LIMITED RIGHT UPPER QUADRANT

[Series 1: us abdomen limited ruq (liver/gb) · 14 of 31 slices shown]
[im 1/31]
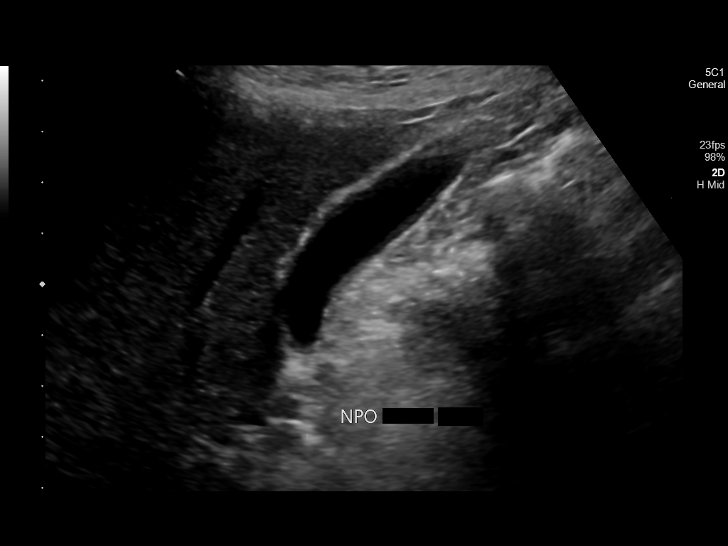
[im 3/31]
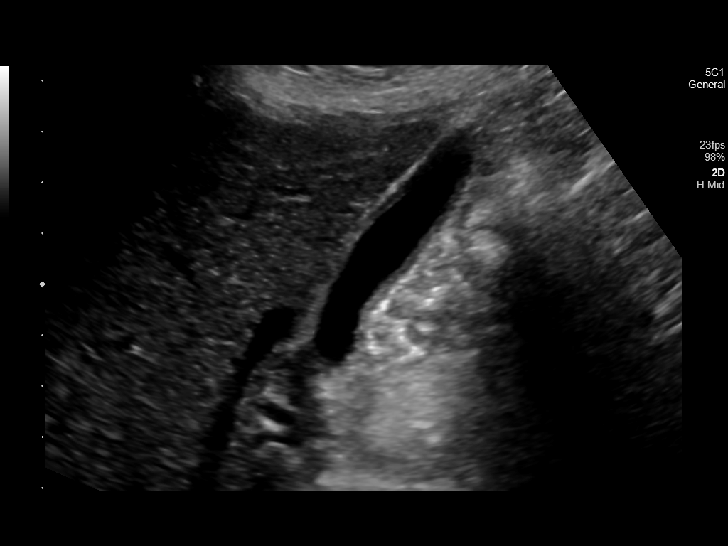
[im 6/31]
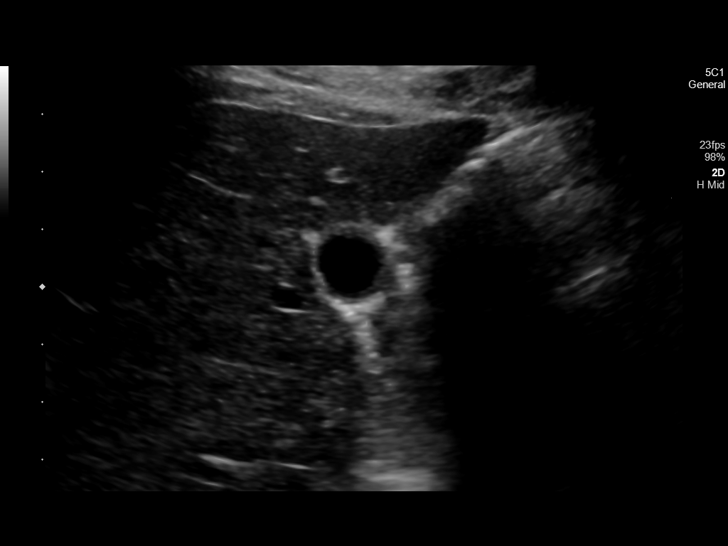
[im 8/31]
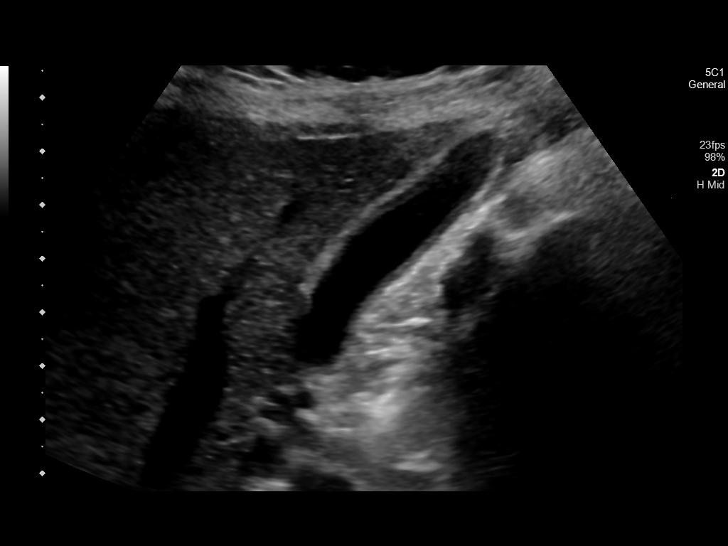
[im 11/31]
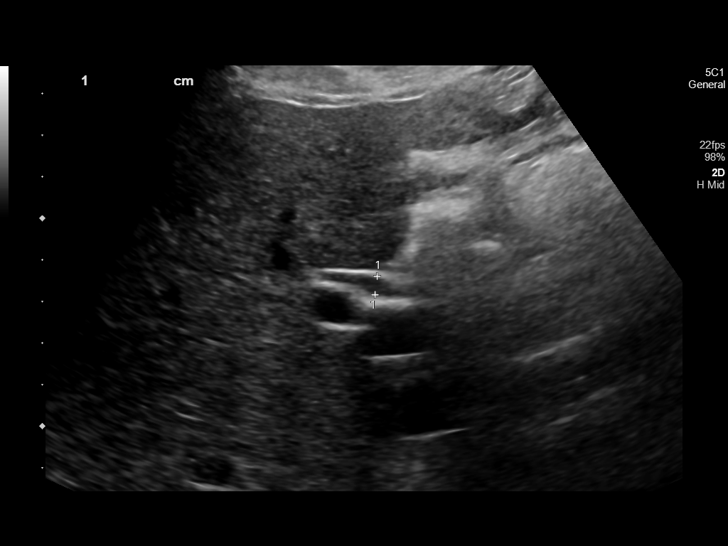
[im 12/31]
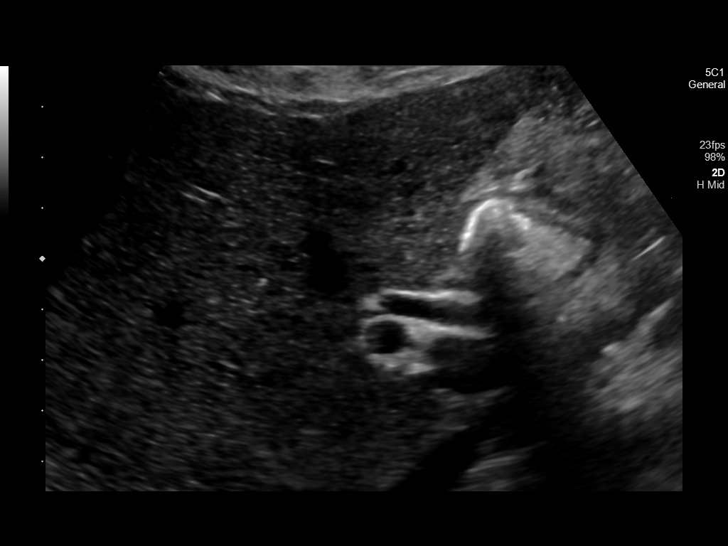
[im 14/31]
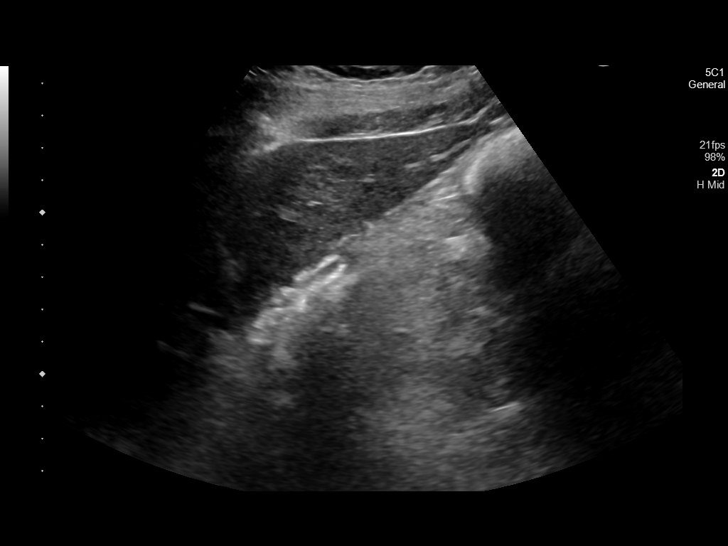
[im 17/31]
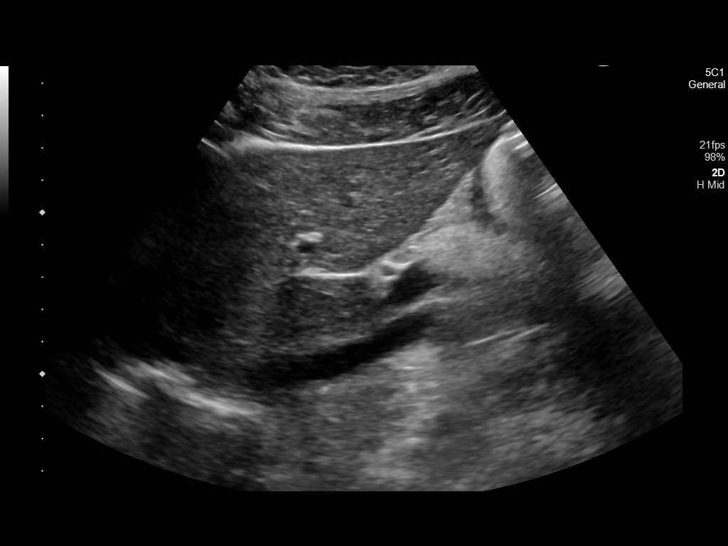
[im 19/31]
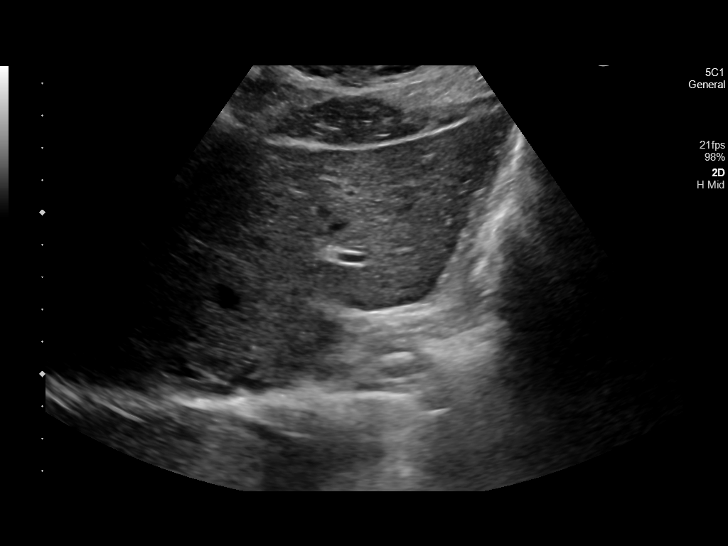
[im 21/31]
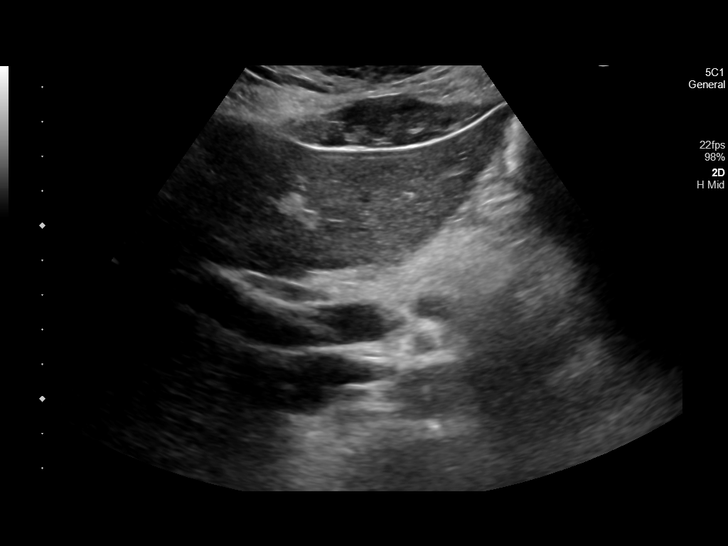
[im 23/31]
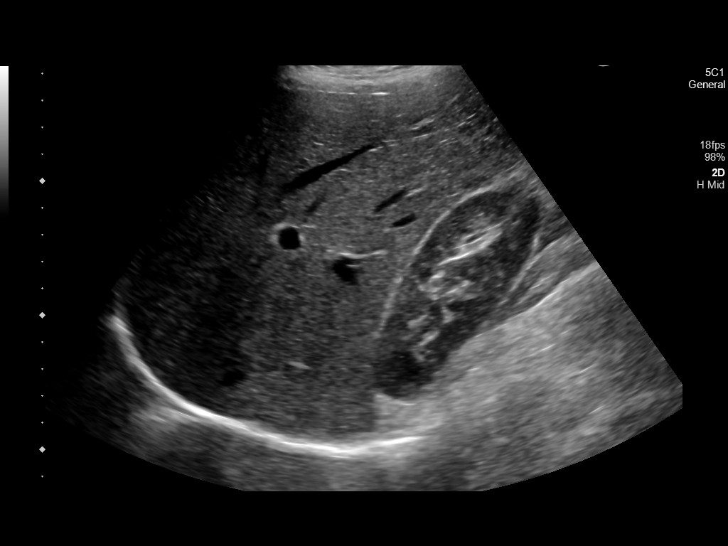
[im 26/31]
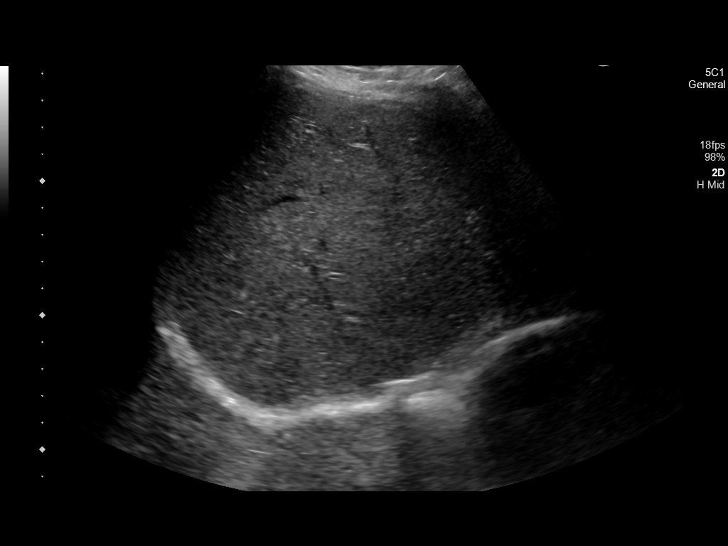
[im 28/31]
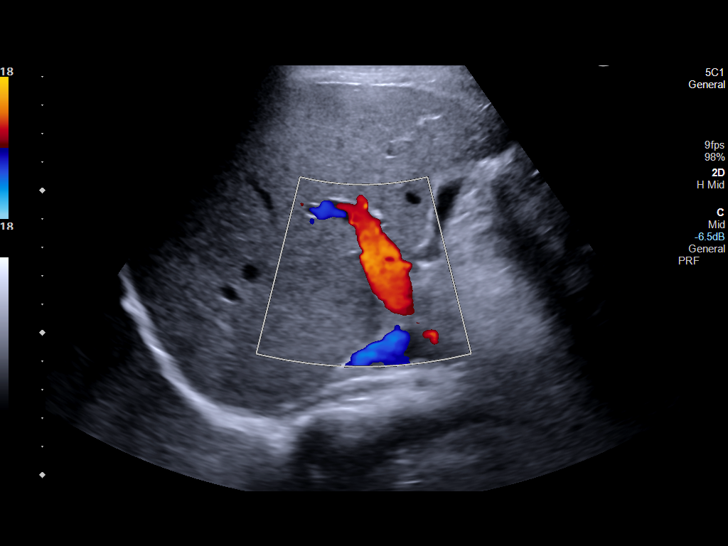
[im 31/31]
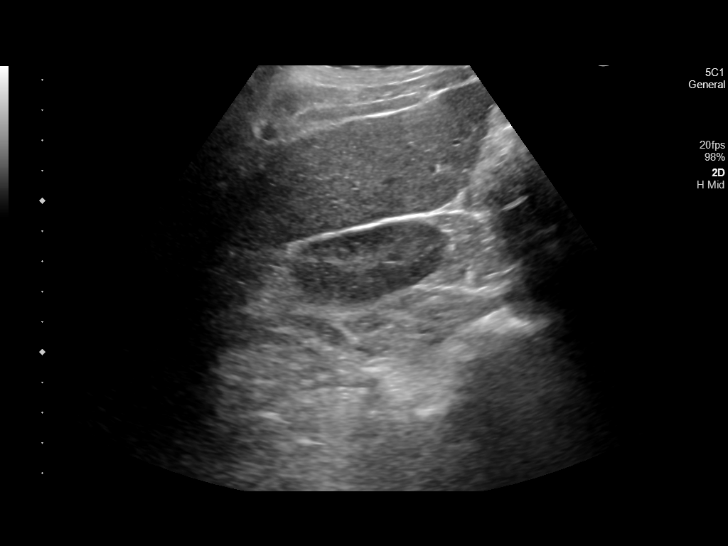

[14 of 25 positions shown; findings below may reference images not displayed]

FINDINGS: Gallbladder:

Gallbladder is contracted. No gallstones or wall thickening
visualized. No sonographic Murphy sign noted by sonographer.

Common bile duct:

Diameter: 5 mm.

Liver:

No focal lesion identified. Within normal limits in parenchymal
echogenicity. Portal vein is patent on color Doppler imaging with
normal direction of blood flow towards the liver.

Other: None.
IMPRESSION: Unremarkable right upper quadrant ultrasound.

## 2022-12-23 ENCOUNTER — Ambulatory Visit: Payer: BC Managed Care – PPO | Admitting: Family Medicine

## 2022-12-23 ENCOUNTER — Encounter: Payer: Self-pay | Admitting: Family Medicine

## 2022-12-23 ENCOUNTER — Other Ambulatory Visit: Payer: Self-pay | Admitting: Family Medicine

## 2022-12-23 VITALS — BP 124/84 | HR 91 | Ht 60.0 in | Wt 145.0 lb

## 2022-12-23 DIAGNOSIS — G43909 Migraine, unspecified, not intractable, without status migrainosus: Secondary | ICD-10-CM

## 2022-12-23 MED ORDER — ONDANSETRON 4 MG PO TBDP
4.0000 mg | ORAL_TABLET | Freq: Three times a day (TID) | ORAL | 2 refills | Status: AC | PRN
Start: 2022-12-23 — End: ?

## 2022-12-23 MED ORDER — RIZATRIPTAN BENZOATE 10 MG PO TBDP: 10.0000 mg | ORAL_TABLET | ORAL | 2 refills | Status: DC | PRN

## 2022-12-23 NOTE — Progress Notes (Signed)
Subjective:    Patient ID: Cheryl Smith, female    DOB: 02-Jun-1981, 42 y.o.   MRN: [id number]  Cheryl Smith is a 42 y.o. female presenting on 12/23/2022 for Migraine and Nausea   HPI  Episodic Migraine Headaches  Reports long term history of migraines even at young age, she was managed at that time and eventually outgrew the migraines.  Now recent reports of migraine headaches returning worse in the past few years, and now more recently more often past few months more severity and more frequent  Usually has pain behind Right eye with headache, and will have whole head R>L pain soreness Sensitivity to light and sound She does not see colors or spots Admits nausea, and rarely vomit  She had tried Excedrin migraine, Tension headache meds. Tylenol / Ibuprofen AS NEEDED  She has received Toradol injection in the past She has tried OTC Lidocaine patch on back of neck. Lay down in dark room will resolve if resting and sleep it off  Usually she experience at least 1 migraine per week, and 4 to 14 migraine days per month        12/23/2022    3:39 PM 01/16/2022    8:21 AM 12/18/2020   11:36 AM  Depression screen PHQ 2/9  Decreased Interest 0 1 1  Down, Depressed, Hopeless 0 1 1  PHQ - 2 Score 0 2 2  Altered sleeping 0 2 2  Tired, decreased energy 0 2 3  Change in appetite 0 2 3  Feeling bad or failure about yourself  0 1 1  Trouble concentrating 0 1 0  Moving slowly or fidgety/restless 0 0 0  Suicidal thoughts 0 0 0  PHQ-9 Score 0 10 11  Difficult doing work/chores Not difficult at all Somewhat difficult Somewhat difficult    Social History   Tobacco Use   Smoking status: Former    Packs/day: 0.25    Years: 5.00    Total pack years: 1.25    Types: Cigarettes    Quit date: 12/01/2018    Years since quitting: 4.0   Smokeless tobacco: Never  Vaping Use   Vaping Use: Never used  Substance Use Topics   Alcohol use: Not Currently   Drug use: Not Currently     Review of Systems Per HPI unless specifically indicated above     Objective:    BP 124/84   Pulse 91   Ht 5' (1.524 m)   Wt 145 lb (65.8 kg)   SpO2 97%   BMI 28.32 kg/m   Wt Readings from Last 3 Encounters:  12/23/22 145 lb (65.8 kg)  01/16/22 145 lb 3.2 oz (65.9 kg)  04/17/21 146 lb (66.2 kg)    Physical Exam Vitals and nursing note reviewed.  Constitutional:      General: She is not in acute distress.    Appearance: Normal appearance. She is well-developed. She is not diaphoretic.     Comments: Well-appearing, comfortable, cooperative  HENT:     Head: Normocephalic and atraumatic.  Eyes:     General:        Right eye: No discharge.        Left eye: No discharge.     Conjunctiva/sclera: Conjunctivae normal.  Cardiovascular:     Rate and Rhythm: Normal rate.  Pulmonary:     Effort: Pulmonary effort is normal.  Skin:    General: Skin is warm and dry.     Findings: No erythema or rash.  Neurological:     Mental Status: She is alert and oriented to person, place, and time.  Psychiatric:        Mood and Affect: Mood normal.        Behavior: Behavior normal.        Thought Content: Thought content normal.     Comments: Well groomed, good eye contact, normal speech and thoughts    Results for orders placed or performed during the hospital encounter of 98/33/82  Basic metabolic panel  Result Value Ref Range   Sodium 139 135 - 145 mmol/L   Potassium 3.6 3.5 - 5.1 mmol/L   Chloride 101 98 - 111 mmol/L   CO2 29 22 - 32 mmol/L   Glucose, Bld 86 70 - 99 mg/dL   BUN 15 6 - 20 mg/dL   Creatinine, Ser 1.13 (H) 0.44 - 1.00 mg/dL   Calcium 9.9 8.9 - 10.3 mg/dL   GFR, Estimated >60 >60 mL/min   Anion gap 9 5 - 15  CBC  Result Value Ref Range   WBC 8.5 4.0 - 10.5 K/uL   RBC 4.52 3.87 - 5.11 MIL/uL   Hemoglobin 13.6 12.0 - 15.0 g/dL   HCT 40.7 36.0 - 46.0 %   MCV 90.0 80.0 - 100.0 fL   MCH 30.1 26.0 - 34.0 pg   MCHC 33.4 30.0 - 36.0 g/dL   RDW 12.2 11.5 - 15.5 %    Platelets 407 (H) 150 - 400 K/uL   nRBC 0.0 0.0 - 0.2 %  Urinalysis, Complete w Microscopic Urine, Clean Catch  Result Value Ref Range   Color, Urine COLORLESS (A) YELLOW   APPearance CLEAR (A) CLEAR   Specific Gravity, Urine 1.002 (L) 1.005 - 1.030   pH 8.0 5.0 - 8.0   Glucose, UA NEGATIVE NEGATIVE mg/dL   Hgb urine dipstick NEGATIVE NEGATIVE   Bilirubin Urine NEGATIVE NEGATIVE   Ketones, ur NEGATIVE NEGATIVE mg/dL   Protein, ur NEGATIVE NEGATIVE mg/dL   Nitrite NEGATIVE NEGATIVE   Leukocytes,Ua NEGATIVE NEGATIVE   WBC, UA NONE SEEN 0 - 5 WBC/hpf   Bacteria, UA NONE SEEN NONE SEEN   Squamous Epithelial / HPF 0-5 0 - 5  Hepatic function panel  Result Value Ref Range   Total Protein 7.4 6.5 - 8.1 g/dL   Albumin 4.3 3.5 - 5.0 g/dL   AST 18 15 - 41 U/L   ALT 17 0 - 44 U/L   Alkaline Phosphatase 79 38 - 126 U/L   Total Bilirubin 0.4 0.3 - 1.2 mg/dL   Bilirubin, Direct <0.1 0.0 - 0.2 mg/dL   Indirect Bilirubin NOT CALCULATED 0.3 - 0.9 mg/dL  Lipase, blood  Result Value Ref Range   Lipase 32 11 - 51 U/L  POC urine preg, ED  Result Value Ref Range   Preg Test, Ur NEGATIVE NEGATIVE  Troponin I (High Sensitivity)  Result Value Ref Range   Troponin I (High Sensitivity) <2 <18 ng/L  Troponin I (High Sensitivity)  Result Value Ref Range   Troponin I (High Sensitivity) 2 <18 ng/L      Assessment & Plan:   Problem List Items Addressed This Visit   None Visit Diagnoses     Episodic migraine    -  Primary   Relevant Medications   rizatriptan (MAXALT-MLT) 10 MG disintegrating tablet   ondansetron (ZOFRAN-ODT) 4 MG disintegrating tablet       Consistent with episodic severe migraine headaches, recurrent now over several months, had been dormant  and not active for years prior. Childhood history of migraines Uncertain precise trigger, has multiple possible triggers Currently with migraine headache, but well appearing. no focal neuro deficits, tolerating PO w/o n/v -  Inadequately treated for migraine HA at home  Plan: 1. Start abortive therapy with Rizatriptan 10mg  ODT  - take 1 PRN (#10, 0 refill due to quantity limit), severe HA, may repeat dose within 2 hr if persistent, no more in 24 hours. Counseling on potential side effect / intolerance with chest discomfort acutely after taking triptan  Rx Zofran ODT AS NEEDED nausea and migraine as well.  2. Recommend taking Ibuprofen 600-800mg  q 8 hr PRN, and can try Tylenol 1000mg  TID alternatively 3. Avoid triggers including foods, caffeine. Important to rest.  Discussion on newer medication options for abortive vs prevention. with some concerns on rx coverage . She will look into coverage  Samples given for trial on Nurtec ODT, Ubrelvy 100mg , and Qulipta    Meds ordered this encounter  Medications   rizatriptan (MAXALT-MLT) 10 MG disintegrating tablet    Sig: Take 1 tablet (10 mg total) by mouth as needed for migraine. May repeat in 2 hours if needed    Dispense:  10 tablet    Refill:  2   ondansetron (ZOFRAN-ODT) 4 MG disintegrating tablet    Sig: Take 1 tablet (4 mg total) by mouth every 8 (eight) hours as needed for nausea or vomiting.    Dispense:  30 tablet    Refill:  2     Follow up plan: Return in about 4 weeks (around 01/20/2023) for 4-6 weeks migraine follow-up.   [employer], DO [facility] 12/23/2022, 3:57 PM

## 2022-12-23 NOTE — Patient Instructions (Addendum)
Thank you for coming to the office today.  [facility].  1. You most likely have Chronic Migraine Headaches - Migraine headaches present differently for many patients, pain is usually throbbing or aching, often on one side of head or behind the eye. They tend to last for up to hours or days. In treating migraines, our goal is to 1) stop the headache and 2) prevent recurrence of headaches   Treatment to STOP the headache at this time: - Start with Rizatriptan (Maxalt) 10mg  dissolving - take 1 immediately at onset of moderate to severe migraine headache, if unresolved or return within 2 hours then repeat dose 1 tablet, that is max dose for 24 hours. (Note - this medication can cause a brief episode of flushing and chest pressure or pain very soon after taking it. That is NORMAL, and it is the medicine taking effect and dilating some blood vessels. It should pass, and resolve within seconds to minutes after - it may not happen at all)  - Try this for 1-2 weeks, if absolutely NOT helping then contact me to discuss changes, possibly can double sumatriptan dose or try nasal spray - You can still take over the counter meds with Ibuprofen up to 600-800mg  per dose 3 times a day with food for a few days and may try Excedrin Migraine as needed, ONLY use these after you have tried Sumatriptan up to 2 doses, and try to limit their use if they are not effective  Treatment to PREVENT headaches: - Goal is to avoid triggers. We need to learn more details on what are your exact or possible headache triggers first. - Keep detailed headache diary (on printed handout) for possible triggers, bring this to your next visit to discuss further - Known possible triggers include caffeine, chocolate, alcohol, stress, weather changes, menstrual cycle, certain other foods - Also be aware that OTC pain meds/anti-inflammatories can cause rebound headache, they help resolve the headache but then after  the effect wears off they can CAUSE a headache. Try to taper down and stop these medications and allow them to get out of your system for 1-2 weeks   Future we can consider prevention medications for migraines as well.  The future migraine medications are as follows   Nurtec-ODT 75mg  - through a mail order specialty pharmacy - [facility]  Roselyn Meier - oral tab AS NEEDED 50-100mg  , same instructions as the Rizatriptan  Qulipta - DAILY prevention medication.  Please schedule a Follow-up Appointment to: Return in about 4 weeks (around 01/20/2023) for 4-6 weeks migraine follow-up.  If you have any other questions or concerns, please feel free to call the office or send a message through [place]. You may also schedule an earlier appointment if necessary.  Additionally, you may be receiving a survey about your experience at our office within a few days to 1 week by e-mail or mail. We value your feedback.  Nobie Putnam, DO [place]

## 2022-12-24 MED ORDER — SUMATRIPTAN SUCCINATE 50 MG PO TABS: 50.0000 mg | ORAL_TABLET | Freq: Once | ORAL | 2 refills | Status: DC | PRN

## 2022-12-24 NOTE — Telephone Encounter (Signed)
Switched to Sumatriptan 50mg . Called pharmacy and they confirmed Rizatriptan ODT was not covered by her ins but Sumatriptan will be covered $10.  Nobie Putnam, DO [facility] 12/24/2022, 4:49 PM

## 2022-12-24 NOTE — Telephone Encounter (Signed)
Pharmacy comment: Alternative Requested:DRUG NOT COVERED.

## 2023-01-09 ENCOUNTER — Other Ambulatory Visit: Payer: Self-pay | Admitting: Family Medicine

## 2023-01-09 DIAGNOSIS — F331 Major depressive disorder, recurrent, moderate: Secondary | ICD-10-CM

## 2023-01-09 DIAGNOSIS — F411 Generalized anxiety disorder: Secondary | ICD-10-CM

## 2023-01-09 NOTE — Telephone Encounter (Signed)
Requested Prescriptions  Pending Prescriptions Disp Refills   FLUoxetine (PROZAC) 20 MG capsule [Pharmacy Med Name: FLUOXETINE HCL 20 MG CAPSULE] 90 capsule 0    Sig: TAKE 3 CAPSULES BY MOUTH EVERY DAY     Psychiatry:  Antidepressants - SSRI Passed - 01/09/2023  4:29 AM      Passed - Completed PHQ-2 or PHQ-9 in the last 360 days      Passed - Valid encounter within last 6 months    Recent Outpatient Visits           2 weeks ago Episodic migraine   [place], DO   11 months ago GAD (generalized anxiety disorder)   [place], DO   2 years ago Major depressive disorder, recurrent, moderate [facility])   [place], DO   2 years ago Annual physical exam   [facility] Olin Hauser, DO   3 years ago Acute non-recurrent maxillary sinusitis   [place], Devonne Doughty, [place]

## 2023-01-16 ENCOUNTER — Other Ambulatory Visit: Payer: Self-pay | Admitting: Family Medicine

## 2023-01-16 DIAGNOSIS — F331 Major depressive disorder, recurrent, moderate: Secondary | ICD-10-CM

## 2023-01-17 ENCOUNTER — Other Ambulatory Visit: Payer: Self-pay | Admitting: Family Medicine

## 2023-01-17 DIAGNOSIS — F331 Major depressive disorder, recurrent, moderate: Secondary | ICD-10-CM

## 2023-01-19 NOTE — Telephone Encounter (Signed)
Unable to refill per protocol, Rx request refilled 01/19/23 for 90 and 3 refills.  Requested Prescriptions  Pending Prescriptions Disp Refills   buPROPion (WELLBUTRIN XL) 300 MG 24 hr tablet [Pharmacy Med Name: BUPROPION HCL XL 300 MG TABLET] 90 tablet 3    Sig: TAKE 1 TABLET BY MOUTH EVERY DAY     Psychiatry: Antidepressants - bupropion Failed - 01/17/2023  4:27 PM      Failed - Cr in normal range and within 360 days    Creat  Date Value Ref Range Status  04/16/2020 1.00 0.50 - 1.10 mg/dL Final   Creatinine, Ser  Date Value Ref Range Status  04/12/2021 1.13 (H) 0.44 - 1.00 mg/dL Final         Failed - AST in normal range and within 360 days    AST  Date Value Ref Range Status  04/12/2021 18 15 - 41 U/L Final         Failed - ALT in normal range and within 360 days    ALT  Date Value Ref Range Status  04/12/2021 17 0 - 44 U/L Final         Passed - Completed PHQ-2 or PHQ-9 in the last 360 days      Passed - Last BP in normal range    BP Readings from Last 1 Encounters:  12/23/22 124/84         Passed - Valid encounter within last 6 months    Recent Outpatient Visits           3 weeks ago Episodic migraine   [place], DO   1 year ago GAD (generalized anxiety disorder)   [place], DO   2 years ago Major depressive disorder, recurrent, moderate [facility])   [place], Alexander J, DO   2 years ago Annual physical exam   [place], DO   3 years ago Acute non-recurrent maxillary sinusitis   [place], Devonne Doughty, [place]

## 2023-01-19 NOTE — Telephone Encounter (Signed)
Requested medications are due for refill today.  yes  Requested medications are on the active medications list.  yes  Last refill. 01/16/2022 #90 3rf  Future visit scheduled.   no  Notes to clinic.  Labs are expired.    Requested Prescriptions  Pending Prescriptions Disp Refills   buPROPion (WELLBUTRIN XL) 300 MG 24 hr tablet [Pharmacy Med Name: BUPROPION HCL XL 300 MG TABLET] 90 tablet 3    Sig: TAKE 1 TABLET BY MOUTH EVERY DAY     Psychiatry: Antidepressants - bupropion Failed - 01/16/2023  2:02 PM      Failed - Cr in normal range and within 360 days    Creat  Date Value Ref Range Status  04/16/2020 1.00 0.50 - 1.10 mg/dL Final   Creatinine, Ser  Date Value Ref Range Status  04/12/2021 1.13 (H) 0.44 - 1.00 mg/dL Final         Failed - AST in normal range and within 360 days    AST  Date Value Ref Range Status  04/12/2021 18 15 - 41 U/L Final         Failed - ALT in normal range and within 360 days    ALT  Date Value Ref Range Status  04/12/2021 17 0 - 44 U/L Final         Passed - Completed PHQ-2 or PHQ-9 in the last 360 days      Passed - Last BP in normal range    BP Readings from Last 1 Encounters:  12/23/22 124/84         Passed - Valid encounter within last 6 months    Recent Outpatient Visits           3 weeks ago Episodic migraine   [place], DO   1 year ago GAD (generalized anxiety disorder)   [place], DO   2 years ago Major depressive disorder, recurrent, moderate [facility])   [place], Alexander J, DO   2 years ago Annual physical exam   [place], DO   3 years ago Acute non-recurrent maxillary sinusitis   [place], Devonne Doughty, [place]

## 2023-01-22 ENCOUNTER — Other Ambulatory Visit: Payer: Self-pay | Admitting: Family Medicine

## 2023-01-22 DIAGNOSIS — F331 Major depressive disorder, recurrent, moderate: Secondary | ICD-10-CM

## 2023-01-22 NOTE — Telephone Encounter (Signed)
Refilled 01/19/2023 #90 3 rf - confirmed by pharmacy. Requested Prescriptions  Refused Prescriptions Disp Refills   buPROPion (WELLBUTRIN XL) 300 MG 24 hr tablet [Pharmacy Med Name: BUPROPION HCL XL 300 MG TABLET] 90 tablet 3    Sig: TAKE 1 TABLET BY MOUTH EVERY DAY     Psychiatry: Antidepressants - bupropion Failed - 01/22/2023 11:21 AM      Failed - Cr in normal range and within 360 days    Creat  Date Value Ref Range Status  04/16/2020 1.00 0.50 - 1.10 mg/dL Final   Creatinine, Ser  Date Value Ref Range Status  04/12/2021 1.13 (H) 0.44 - 1.00 mg/dL Final         Failed - AST in normal range and within 360 days    AST  Date Value Ref Range Status  04/12/2021 18 15 - 41 U/L Final         Failed - ALT in normal range and within 360 days    ALT  Date Value Ref Range Status  04/12/2021 17 0 - 44 U/L Final         Passed - Completed PHQ-2 or PHQ-9 in the last 360 days      Passed - Last BP in normal range    BP Readings from Last 1 Encounters:  12/23/22 124/84         Passed - Valid encounter within last 6 months    Recent Outpatient Visits           1 month ago Episodic migraine   [place], DO   1 year ago GAD (generalized anxiety disorder)   [place], DO   2 years ago Major depressive disorder, recurrent, moderate [facility])   [place], Devonne Doughty, DO   2 years ago Annual physical exam   [place], DO   3 years ago Acute non-recurrent maxillary sinusitis   [place], Devonne Doughty, [place]

## 2023-02-08 ENCOUNTER — Other Ambulatory Visit: Payer: Self-pay | Admitting: Family Medicine

## 2023-02-08 DIAGNOSIS — F411 Generalized anxiety disorder: Secondary | ICD-10-CM

## 2023-02-08 DIAGNOSIS — F331 Major depressive disorder, recurrent, moderate: Secondary | ICD-10-CM

## 2023-02-09 NOTE — Telephone Encounter (Signed)
Requested medication (s) are due for refill today: yes  Requested medication (s) are on the active medication list: yes  Last refill:  01/09/23  Future visit scheduled: no  Notes to clinic:  Pharmacy comment: REQUEST FOR 90 DAYS PRESCRIPTION. DX Code Needed.      Requested Prescriptions  Pending Prescriptions Disp Refills   FLUoxetine (PROZAC) 20 MG capsule [Pharmacy Med Name: FLUOXETINE HCL 20 MG CAPSULE] 270 capsule 1    Sig: TAKE 3 CAPSULES BY MOUTH EVERY DAY     Psychiatry:  Antidepressants - SSRI Passed - 02/08/2023  8:32 AM      Passed - Completed PHQ-2 or PHQ-9 in the last 360 days      Passed - Valid encounter within last 6 months    Recent Outpatient Visits           1 month ago Episodic migraine   [place], DO   1 year ago GAD (generalized anxiety disorder)   [place], DO   2 years ago Major depressive disorder, recurrent, moderate [facility])   [place], DO   2 years ago Annual physical exam   [facility] O[place] Haus[place], DO   3 years ago Acute non-recurrent maxillary sinusitis   [facility] [place], Devonne Doughty, [place]

## 2023-03-25 ENCOUNTER — Other Ambulatory Visit: Payer: Self-pay | Admitting: Family Medicine

## 2023-03-25 DIAGNOSIS — G43909 Migraine, unspecified, not intractable, without status migrainosus: Secondary | ICD-10-CM

## 2023-03-25 NOTE — Telephone Encounter (Signed)
Requested Prescriptions  Pending Prescriptions Disp Refills   SUMAtriptan (IMITREX) 50 MG tablet [Pharmacy Med Name: SUMATRIPTAN SUCC 50 MG TABLET] 12 tablet 0    Sig: TAKE 1 TABLET BY MOUTH ONCE AS NEEDED FOR UP TO 1 DOSE FOR MIGRAINE. MAY REPEAT ONE DOSE IN 2 HOURS IF HEADACHE PERSISTS, FOR MAX DOSE 24 HOURS     Neurology:  Migraine Therapy - Triptan Passed - 03/25/2023  1:26 AM      Passed - Last BP in normal range    BP Readings from Last 1 Encounters:  12/23/22 124/84         Passed - Valid encounter within last 12 months    Recent Outpatient Visits           3 months ago Episodic migraine   [place] [facility] [place], Netta Neat, DO   1 year ago GAD (generalized anxiety disorder)   [place] [facility] Smitty Cords, DO   2 years ago Major depressive disorder, recurrent, moderate [facility])   [place] [facility] Smitty Cords, DO   2 years ago Annual physical exam   [facility] Smitty Cords, DO   3 years ago Acute non-recurrent maxillary sinusitis   [facility] [place], Netta Neat, [place]

## 2023-04-02 ENCOUNTER — Encounter: Payer: Self-pay | Admitting: Family Medicine

## 2023-04-02 ENCOUNTER — Ambulatory Visit: Payer: BC Managed Care – PPO | Admitting: Family Medicine

## 2023-04-02 VITALS — BP 112/70 | HR 110 | Ht 60.0 in | Wt 137.0 lb

## 2023-04-02 DIAGNOSIS — F900 Attention-deficit hyperactivity disorder, predominantly inattentive type: Secondary | ICD-10-CM | POA: Diagnosis not present

## 2023-04-02 DIAGNOSIS — F411 Generalized anxiety disorder: Secondary | ICD-10-CM

## 2023-04-02 DIAGNOSIS — G43909 Migraine, unspecified, not intractable, without status migrainosus: Secondary | ICD-10-CM | POA: Insufficient documentation

## 2023-04-02 MED ORDER — UBRELVY 100 MG PO TABS: ORAL_TABLET | ORAL | 5 refills | Status: DC

## 2023-04-02 MED ORDER — AMPHETAMINE-DEXTROAMPHET ER 15 MG PO CP24: 15.0000 mg | ORAL_CAPSULE | Freq: Every day | ORAL | 0 refills | Status: DC | PRN

## 2023-04-02 NOTE — Progress Notes (Signed)
(  Key: [id number] Hoar [place] is processing your PA request and will respond shortly with next steps. You may close this dialog, return to your dashboard, and perform other tasks. To check for an update later, open this request again from your dashboard.  If you need assistance, please chat with CoverMyMeds or call us at [phone].

## 2023-04-02 NOTE — Patient Instructions (Addendum)
Thank you for coming to the office today.  Trial the Ubrelvy for migraines Same instructions 1 pill at start of severe migraine, and then repeat a 2nd pill IF NEEDED within 2 hours preferred. Max dose in 24 hours. May need a neuro consult if insurance doesn't approve  Adderall XR 15mg  daily as needed, we can adjust going forward, goal for couple months max, otherwise we can link you up with someone else for behavioral health.   Please schedule a Follow-up Appointment to: Return in about 3 months (around 07/03/2023) for 3 month migraines, ADD updates medications.  If you have any other questions or concerns, please feel free to call the office or send a message through MyChart. You may also schedule an earlier appointment if necessary.  Additionally, you may be receiving a survey about your experience at our office within a few days to 1 week by e-mail or mail. We value your feedback.  Saralyn Pilar, DO [facility], [place]

## 2023-04-02 NOTE — Progress Notes (Signed)
Subjective:    Patient ID: Cheryl Smith, female    DOB: 08/17/1981, 42 y.o.   MRN: [id number]  Cheryl Smith is a 42 y.o. female presenting on 04/02/2023 for Medical Management of Chronic Issues   HPI  Episodic Migrainse Last visit 12/23/22 discussion on episodic migraines, she started Rizatriptan AS NEEDED with good results, had to take 2nd dose often. It was successful as abortive. She tried samples Nurtec, Nicolasa Ducking. She did well with Nurtec and Ubrelvy No migraines in 2 months now Interested in new rx for [place] since it worked well.  Major Depression, moderate recurrent / Anxiety ADHD, inattention   Currently following - [facility] Taking Fluoxetine 20mg  x 3 = 60mg  daily Taking Bupropion XL 300mg  daily  Off Buspar  Lately increased stressors / very busy feels overwhelmed, in past used to take Adderall AS NEEDED with good results.     04/02/2023    8:19 AM 12/23/2022    3:39 PM 01/16/2022    8:21 AM  Depression screen PHQ 2/9  Decreased Interest 0 0 1  Down, Depressed, Hopeless 0 0 1  PHQ - 2 Score 0 0 2  Altered sleeping 0 0 2  Tired, decreased energy 2 0 2  Change in appetite 0 0 2  Feeling bad or failure about yourself  0 0 1  Trouble concentrating 0 0 1  Moving slowly or fidgety/restless 0 0 0  Suicidal thoughts 0 0 0  PHQ-9 Score 2 0 10  Difficult doing work/chores  Not difficult at all Somewhat difficult    Social History   Tobacco Use   Smoking status: Former    Packs/day: 0.25    Years: 5.00    Additional pack years: 0.00    Total pack years: 1.25    Types: Cigarettes    Quit date: 12/01/2018    Years since quitting: 4.3   Smokeless tobacco: Never  Vaping Use   Vaping Use: Never used  Substance Use Topics   Alcohol use: Not Currently   Drug use: Not Currently    Review of Systems Per HPI unless specifically indicated above     Objective:    BP 112/70   Pulse (!) 110   Ht 5' (1.524 m)   Wt 137 lb (62.1 kg)   SpO2  97%   BMI 26.76 kg/m   Wt Readings from Last 3 Encounters:  04/02/23 137 lb (62.1 kg)  12/23/22 145 lb (65.8 kg)  01/16/22 145 lb 3.2 oz (65.9 kg)    Physical Exam Vitals and nursing note reviewed.  Constitutional:      General: She is not in acute distress.    Appearance: Normal appearance. She is well-developed. She is not diaphoretic.     Comments: Well-appearing, comfortable, cooperative  HENT:     Head: Normocephalic and atraumatic.  Eyes:     General:        Right eye: No discharge.        Left eye: No discharge.     Conjunctiva/sclera: Conjunctivae normal.  Cardiovascular:     Rate and Rhythm: Normal rate.  Pulmonary:     Effort: Pulmonary effort is normal.  Skin:    General: Skin is warm and dry.     Findings: No erythema or rash.  Neurological:     Mental Status: She is alert and oriented to person, place, and time.  Psychiatric:        Mood and Affect: Mood normal.  Behavior: Behavior normal.        Thought Content: Thought content normal.     Comments: Well groomed, good eye contact, normal speech and thoughts    Results for orders placed or performed during the hospital encounter of 04/12/21  Basic metabolic panel  Result Value Ref Range   Sodium 139 135 - 145 mmol/L   Potassium 3.6 3.5 - 5.1 mmol/L   Chloride 101 98 - 111 mmol/L   CO2 29 22 - 32 mmol/L   Glucose, Bld 86 70 - 99 mg/dL   BUN 15 6 - 20 mg/dL   Creatinine, Ser 1.61 (H) 0.44 - 1.00 mg/dL   Calcium 9.9 8.9 - 09.6 mg/dL   GFR, Estimated >04 >54 mL/min   Anion gap 9 5 - 15  CBC  Result Value Ref Range   WBC 8.5 4.0 - 10.5 K/uL   RBC 4.52 3.87 - 5.11 MIL/uL   Hemoglobin 13.6 12.0 - 15.0 g/dL   HCT 09.8 11.9 - 14.7 %   MCV 90.0 80.0 - 100.0 fL   MCH 30.1 26.0 - 34.0 pg   MCHC 33.4 30.0 - 36.0 g/dL   RDW 82.9 56.2 - 13.0 %   Platelets 407 (H) 150 - 400 K/uL   nRBC 0.0 0.0 - 0.2 %  Urinalysis, Complete w Microscopic Urine, Clean Catch  Result Value Ref Range   Color, Urine  COLORLESS (A) YELLOW   APPearance CLEAR (A) CLEAR   Specific Gravity, Urine 1.002 (L) 1.005 - 1.030   pH 8.0 5.0 - 8.0   Glucose, UA NEGATIVE NEGATIVE mg/dL   Hgb urine dipstick NEGATIVE NEGATIVE   Bilirubin Urine NEGATIVE NEGATIVE   Ketones, ur NEGATIVE NEGATIVE mg/dL   Protein, ur NEGATIVE NEGATIVE mg/dL   Nitrite NEGATIVE NEGATIVE   Leukocytes,Ua NEGATIVE NEGATIVE   WBC, UA NONE SEEN 0 - 5 WBC/hpf   Bacteria, UA NONE SEEN NONE SEEN   Squamous Epithelial / HPF 0-5 0 - 5  Hepatic function panel  Result Value Ref Range   Total Protein 7.4 6.5 - 8.1 g/dL   Albumin 4.3 3.5 - 5.0 g/dL   AST 18 15 - 41 U/L   ALT 17 0 - 44 U/L   Alkaline Phosphatase 79 38 - 126 U/L   Total Bilirubin 0.4 0.3 - 1.2 mg/dL   Bilirubin, Direct <8.6 0.0 - 0.2 mg/dL   Indirect Bilirubin NOT CALCULATED 0.3 - 0.9 mg/dL  Lipase, blood  Result Value Ref Range   Lipase 32 11 - 51 U/L  POC urine preg, ED  Result Value Ref Range   Preg Test, Ur NEGATIVE NEGATIVE  Troponin I (High Sensitivity)  Result Value Ref Range   Troponin I (High Sensitivity) <2 <18 ng/L  Troponin I (High Sensitivity)  Result Value Ref Range   Troponin I (High Sensitivity) 2 <18 ng/L      Assessment & Plan:   Problem List Items Addressed This Visit     GAD (generalized anxiety disorder)   Other Visit Diagnoses     Episodic migraine    -  Primary   Relevant Medications   UBRELVY 100 MG TABS   Attention deficit hyperactivity disorder (ADHD), predominantly inattentive type       Relevant Medications   amphetamine-dextroamphetamine (ADDERALL XR) 15 MG 24 hr capsule       Episodic Migraine >4-14 HA days per month Now improved on therapy. Improved on abortive therapy Sumatriptan, Rizatriptan. Best results on Ubrelvy.  Rx Trial the [employer]  for migraines Same instructions 1 pill at start of severe migraine, and then repeat a 2nd pill IF NEEDED within 2 hours preferred. Max dose in 24 hours. May need a neuro consult if  insurance doesn't approve  ADD Comorbid with mood/anxiety Currently overwhelmed inattention impacting her function  Adderall XR 15mg  daily as needed, we can adjust going forward, goal for couple months max, otherwise we can link you up with psych  Meds ordered this encounter  Medications   UBRELVY 100 MG TABS    Sig: TAKE 1 TABLET BY MOUTH ONCE AS NEEDED FOR UP TO 1 DOSE FOR MIGRAINE. MAY REPEAT ONE DOSE IN 2 HOURS IF HEADACHE PERSISTS, FOR MAX DOSE 24 HOURS    Dispense:  16 tablet    Refill:  5   amphetamine-dextroamphetamine (ADDERALL XR) 15 MG 24 hr capsule    Sig: Take 1 capsule by mouth daily as needed (inattention ADD).    Dispense:  30 capsule    Refill:  0      Follow up plan: Return in about 3 months (around 07/03/2023) for 3 month migraines, ADD updates medications.   Saralyn Pilar, DO [facility] 04/02/2023, 8:32 AM

## 2023-04-06 NOTE — Progress Notes (Signed)
Bernita Raisin approved 04/02/2023-06/25/2023

## 2023-04-21 ENCOUNTER — Other Ambulatory Visit: Payer: Self-pay | Admitting: Family Medicine

## 2023-04-21 DIAGNOSIS — G43909 Migraine, unspecified, not intractable, without status migrainosus: Secondary | ICD-10-CM

## 2023-04-21 NOTE — Telephone Encounter (Signed)
Requested Prescriptions  Pending Prescriptions Disp Refills   SUMAtriptan (IMITREX) 50 MG tablet [Pharmacy Med Name: SUMATRIPTAN SUCC 50 MG TABLET] 12 tablet 0    Sig: TAKE 1 TABLET BY MOUTH ONCE AS NEEDED FOR UP TO 1 DOSE FOR MIGRAINE. MAY REPEAT ONE DOSE IN 2 HOURS IF HEADACHE PERSISTS, FOR MAX DOSE 24 HOURS     Neurology:  Migraine Therapy - Triptan Passed - 04/21/2023  2:17 AM      Passed - Last BP in normal range    BP Readings from Last 1 Encounters:  04/02/23 112/70         Passed - Valid encounter within last 12 months    Recent Outpatient Visits           2 weeks ago Episodic migraine   [place] [facility] [place], Netta Neat, DO   3 months ago Episodic migraine   [place] [facility] Smitty Cords, DO   1 year ago GAD (generalized anxiety disorder)   [place] [facility] Smitty Cords, DO   2 years ago Major depressive disorder, recurrent, moderate [facility])   [facility] Smitty Cords, DO   3 years ago Annual physical exam   [place] [facility] Smitty Cords, DO       Future Appointments             In 2 months [place] Charon, Netta Neat, DO [place] [facility]

## 2023-04-30 ENCOUNTER — Other Ambulatory Visit: Payer: Self-pay | Admitting: Family Medicine

## 2023-04-30 DIAGNOSIS — F900 Attention-deficit hyperactivity disorder, predominantly inattentive type: Secondary | ICD-10-CM

## 2023-04-30 MED ORDER — AMPHETAMINE-DEXTROAMPHET ER 15 MG PO CP24: 15.0000 mg | ORAL_CAPSULE | Freq: Every day | ORAL | 0 refills | Status: DC | PRN

## 2023-06-08 ENCOUNTER — Other Ambulatory Visit: Payer: Self-pay | Admitting: Internal Medicine

## 2023-06-08 DIAGNOSIS — F900 Attention-deficit hyperactivity disorder, predominantly inattentive type: Secondary | ICD-10-CM

## 2023-06-08 MED ORDER — AMPHETAMINE-DEXTROAMPHET ER 15 MG PO CP24: 15.0000 mg | ORAL_CAPSULE | Freq: Every day | ORAL | 0 refills | Status: DC | PRN

## 2023-06-16 ENCOUNTER — Telehealth: Payer: Self-pay

## 2023-06-16 NOTE — Telephone Encounter (Signed)
(  Key: [id number] Hoar [place] is processing your PA request and will respond shortly with next steps. You may close this dialog, return to your dashboard, and perform other tasks. To check for an update later, open this request again from your dashboard.  If you need assistance, please chat with CoverMyMeds or call us at [phone].

## 2023-06-18 NOTE — Telephone Encounter (Signed)
(  Key: [id number]) PA Case ID #: [id number] Need Help? Call us at [phone] Status sent iconSent to Plan today Drug Bernita Raisin 100MG  tablets ePA cloud logo Form [employer] [place] [employer] Electronic Request Form

## 2023-06-22 NOTE — Telephone Encounter (Signed)
(  Key: [id number]) PA Case ID #: [id number] Need Help? Call us at [phone] Outcome Approved on July 19 Approved. Authorization Expiration Date: 06/17/2024 Drug Bernita Raisin 100MG  tablets ePA cloud logo Form [employer] [place] [employer] Electronic Request Form

## 2023-07-02 ENCOUNTER — Other Ambulatory Visit: Payer: Self-pay | Admitting: Family Medicine

## 2023-07-02 ENCOUNTER — Ambulatory Visit: Payer: BC Managed Care – PPO | Admitting: Family Medicine

## 2023-07-02 ENCOUNTER — Encounter: Payer: Self-pay | Admitting: Family Medicine

## 2023-07-02 VITALS — BP 112/76 | HR 101 | Temp 95.9°F | Wt 133.0 lb

## 2023-07-02 DIAGNOSIS — F3341 Major depressive disorder, recurrent, in partial remission: Secondary | ICD-10-CM

## 2023-07-02 DIAGNOSIS — F411 Generalized anxiety disorder: Secondary | ICD-10-CM | POA: Diagnosis not present

## 2023-07-02 DIAGNOSIS — G43909 Migraine, unspecified, not intractable, without status migrainosus: Secondary | ICD-10-CM | POA: Diagnosis not present

## 2023-07-02 DIAGNOSIS — R7309 Other abnormal glucose: Secondary | ICD-10-CM

## 2023-07-02 DIAGNOSIS — Z0000 Encounter for general adult medical examination without abnormal findings: Secondary | ICD-10-CM

## 2023-07-02 DIAGNOSIS — F900 Attention-deficit hyperactivity disorder, predominantly inattentive type: Secondary | ICD-10-CM | POA: Diagnosis not present

## 2023-07-02 DIAGNOSIS — Z1231 Encounter for screening mammogram for malignant neoplasm of breast: Secondary | ICD-10-CM | POA: Diagnosis not present

## 2023-07-02 DIAGNOSIS — E559 Vitamin D deficiency, unspecified: Secondary | ICD-10-CM

## 2023-07-02 DIAGNOSIS — E782 Mixed hyperlipidemia: Secondary | ICD-10-CM

## 2023-07-02 MED ORDER — AMPHETAMINE-DEXTROAMPHET ER 15 MG PO CP24
15.0000 mg | ORAL_CAPSULE | Freq: Every day | ORAL | 0 refills | Status: DC | PRN
Start: 2023-09-01 — End: 2023-10-20

## 2023-07-02 MED ORDER — AMPHETAMINE-DEXTROAMPHET ER 15 MG PO CP24: 15.0000 mg | ORAL_CAPSULE | Freq: Every day | ORAL | 0 refills | Status: DC | PRN

## 2023-07-02 MED ORDER — AMPHETAMINE-DEXTROAMPHET ER 15 MG PO CP24
15.0000 mg | ORAL_CAPSULE | Freq: Every day | ORAL | 0 refills | Status: DC | PRN
Start: 2023-07-06 — End: 2023-10-20

## 2023-07-02 NOTE — Assessment & Plan Note (Addendum)
Recurrent chronic major depression, in partial remission at this time. Assoc with comorbid anxiety and insomnia Improved on med management - continues on SSRI Fluoxetine high dose, Wellbutrin XL, Buspar Followed by counselor / therapist

## 2023-07-02 NOTE — Patient Instructions (Addendum)
Thank you for coming to the office today.  Keep on ubrelvy  Continue Adderall XR 15mg  dosing, 3 new orders sent.  Earliest fill dates are  07/06/23 08/03/23 10/1  For Mammogram screening for breast cancer   Call the Imaging Center below anytime to schedule your own appointment now that order has been placed.  [facility] [address], [address] [place], [place] [address] Phone: [phone]  Please schedule a Follow-up Appointment to: Return in about 4 months (around 11/01/2023) for 4-5 month fasting lab only then 1 week later Annual Physical AM apt preferred.  If you have any other questions or concerns, please feel free to call the office or send a message through MyChart. You may also schedule an earlier appointment if necessary.  Additionally, you may be receiving a survey about your experience at our office within a few days to 1 week by e-mail or mail. We value your feedback.  Saralyn Pilar, DO [facility], [place]

## 2023-07-02 NOTE — Assessment & Plan Note (Signed)
Controlled ADD inattentive On Adderall XR 15mg  daily Taking most days for work, skip weekends  Continue Adderall XR 15mg  dosing, 3 new orders sent.  Earliest fill dates are  07/06/23 08/03/23 10/1

## 2023-07-02 NOTE — Assessment & Plan Note (Addendum)
Improved control On medication See A&P

## 2023-07-02 NOTE — Assessment & Plan Note (Signed)
Episodic migraine >4 but < 14 migraine HA days per month Controlled on Ubrelvy 100mg  AS NEEDED dosing abortive therapy

## 2023-07-02 NOTE — Progress Notes (Signed)
Subjective:    Patient ID: Cheryl Smith, female    DOB: [birth year], 42 y.o.   MRN: [id number]  Cheryl Smith is a 42 y.o. female presenting on 07/02/2023 for Migraine   HPI  Episodic Migrainse Last visit 04/2023, she was started on Ubrelvy 100mg  abortive therapy, after she tried Rizatriptan previously. She has done very well since starting this abortive therapy. She has had maybe 1 migraine or headache per week approximately. She is taking the [place] about 1 x per week with good results, if she catches migraine HA early, usually 1 pill is successful, otherwise if more advanced headache can take 2 pills with good results.   Major Depression, partial remission / Anxiety ADHD, inattention   Currently following - [facility] Taking Fluoxetine 20mg  x 3 = 60mg  daily Taking Bupropion XL 300mg  daily  Currently on Adderall XR 15mg  daily, she takes mostly on work days during the week, may skip weekends.  Due for next refill 07/06/23   Off Buspar    S/p Hysterectomy since age 45+ Age 50+ Interested in mammogram      07/02/2023    8:45 AM 04/02/2023    8:19 AM 12/23/2022    3:39 PM  Depression screen PHQ 2/9  Decreased Interest 0 0 0  Down, Depressed, Hopeless 0 0 0  PHQ - 2 Score 0 0 0  Altered sleeping 1 0 0  Tired, decreased energy 1 2 0  Change in appetite 0 0 0  Feeling bad or failure about yourself  0 0 0  Trouble concentrating 0 0 0  Moving slowly or fidgety/restless 0 0 0  Suicidal thoughts 0 0 0  PHQ-9 Score 2 2 0  Difficult doing work/chores Not difficult at all  Not difficult at all    Social History   Tobacco Use   Smoking status: Former    Current packs/day: 0.00    Average packs/day: 0.3 packs/day for 5.0 years (1.3 ttl pk-yrs)    Types: Cigarettes    Start date: 12/01/2013    Quit date: 12/01/2018    Years since quitting: 4.5   Smokeless tobacco: Never  Vaping Use   Vaping status: Never Used  Substance Use Topics   Alcohol use: Not Currently    Drug use: Not Currently    Review of Systems Per HPI unless specifically indicated above     Objective:    BP 112/76 (BP Location: Left Arm, Patient Position: Sitting, Cuff Size: Normal)   Pulse (!) 101   Temp (!) 95.9 F (35.5 C) (Temporal)   Wt 133 lb (60.3 kg)   SpO2 99%   BMI 25.97 kg/m   Wt Readings from Last 3 Encounters:  07/02/23 133 lb (60.3 kg)  04/02/23 137 lb (62.1 kg)  12/23/22 145 lb (65.8 kg)    Physical Exam Vitals and nursing note reviewed.  Constitutional:      General: She is not in acute distress.    Appearance: Normal appearance. She is well-developed. She is not diaphoretic.     Comments: Well-appearing, comfortable, cooperative  HENT:     Head: Normocephalic and atraumatic.  Eyes:     General:        Right eye: No discharge.        Left eye: No discharge.     Conjunctiva/sclera: Conjunctivae normal.  Cardiovascular:     Rate and Rhythm: Normal rate.  Pulmonary:     Effort: Pulmonary effort is normal.  Skin:    General:  Skin is warm and dry.     Findings: No erythema or rash.  Neurological:     Mental Status: She is alert and oriented to person, place, and time.  Psychiatric:        Mood and Affect: Mood normal.        Behavior: Behavior normal.        Thought Content: Thought content normal.     Comments: Well groomed, good eye contact, normal speech and thoughts    Results for orders placed or performed during the hospital encounter of 04/12/21  Basic metabolic panel  Result Value Ref Range   Sodium 139 135 - 145 mmol/L   Potassium 3.6 3.5 - 5.1 mmol/L   Chloride 101 98 - 111 mmol/L   CO2 29 22 - 32 mmol/L   Glucose, Bld 86 70 - 99 mg/dL   BUN 15 6 - 20 mg/dL   Creatinine, Ser 7.82 (H) 0.44 - 1.00 mg/dL   Calcium 9.9 8.9 - 95.6 mg/dL   GFR, Estimated >21 >30 mL/min   Anion gap 9 5 - 15  CBC  Result Value Ref Range   WBC 8.5 4.0 - 10.5 K/uL   RBC 4.52 3.87 - 5.11 MIL/uL   Hemoglobin 13.6 12.0 - 15.0 g/dL   HCT 86.5 78.4 -  69.6 %   MCV 90.0 80.0 - 100.0 fL   MCH 30.1 26.0 - 34.0 pg   MCHC 33.4 30.0 - 36.0 g/dL   RDW 29.5 28.4 - 13.2 %   Platelets 407 (H) 150 - 400 K/uL   nRBC 0.0 0.0 - 0.2 %  Urinalysis, Complete w Microscopic Urine, Clean Catch  Result Value Ref Range   Color, Urine COLORLESS (A) YELLOW   APPearance CLEAR (A) CLEAR   Specific Gravity, Urine 1.002 (L) 1.005 - 1.030   pH 8.0 5.0 - 8.0   Glucose, UA NEGATIVE NEGATIVE mg/dL   Hgb urine dipstick NEGATIVE NEGATIVE   Bilirubin Urine NEGATIVE NEGATIVE   Ketones, ur NEGATIVE NEGATIVE mg/dL   Protein, ur NEGATIVE NEGATIVE mg/dL   Nitrite NEGATIVE NEGATIVE   Leukocytes,Ua NEGATIVE NEGATIVE   WBC, UA NONE SEEN 0 - 5 WBC/hpf   Bacteria, UA NONE SEEN NONE SEEN   Squamous Epithelial / HPF 0-5 0 - 5  Hepatic function panel  Result Value Ref Range   Total Protein 7.4 6.5 - 8.1 g/dL   Albumin 4.3 3.5 - 5.0 g/dL   AST 18 15 - 41 U/L   ALT 17 0 - 44 U/L   Alkaline Phosphatase 79 38 - 126 U/L   Total Bilirubin 0.4 0.3 - 1.2 mg/dL   Bilirubin, Direct <4.4 0.0 - 0.2 mg/dL   Indirect Bilirubin NOT CALCULATED 0.3 - 0.9 mg/dL  Lipase, blood  Result Value Ref Range   Lipase 32 11 - 51 U/L  POC urine preg, ED  Result Value Ref Range   Preg Test, Ur NEGATIVE NEGATIVE  Troponin I (High Sensitivity)  Result Value Ref Range   Troponin I (High Sensitivity) <2 <18 ng/L  Troponin I (High Sensitivity)  Result Value Ref Range   Troponin I (High Sensitivity) 2 <18 ng/L      Assessment & Plan:   Problem List Items Addressed This Visit     Attention deficit hyperactivity disorder (ADHD), predominantly inattentive type    Controlled ADD inattentive On Adderall XR 15mg  daily Taking most days for work, skip weekends  Continue Adderall XR 15mg  dosing, 3 new orders sent.  Earliest  fill dates are  07/06/23 08/03/23 10/1      Relevant Medications   amphetamine-dextroamphetamine (ADDERALL XR) 15 MG 24 hr capsule (Start on 08/03/2023)    amphetamine-dextroamphetamine (ADDERALL XR) 15 MG 24 hr capsule (Start on 07/06/2023)   amphetamine-dextroamphetamine (ADDERALL XR) 15 MG 24 hr capsule (Start on 09/01/2023)   Episodic migraine - Primary    Episodic migraine >4 but < 14 migraine HA days per month Controlled on Ubrelvy 100mg  AS NEEDED dosing abortive therapy       GAD (generalized anxiety disorder)    Improved control On medication See A&P      Major depressive disorder, recurrent episode, in partial remission (HCC)    Recurrent chronic major depression, in partial remission at this time. Assoc with comorbid anxiety and insomnia Improved on med management - continues on SSRI Fluoxetine high dose, Wellbutrin XL, Buspar Followed by counselor / therapist      Other Visit Diagnoses     Encounter for screening mammogram for malignant neoplasm of breast       Relevant Orders   MM 3D SCREENING MAMMOGRAM BILATERAL BREAST       For Mammogram screening for breast cancer   Call the Imaging Center below anytime to schedule your own appointment now that order has been placed.  [facility] [address], [address] [place], [place] [address] Phone: [phone]  Meds ordered this encounter  Medications   amphetamine-dextroamphetamine (ADDERALL XR) 15 MG 24 hr capsule    Sig: Take 1 capsule by mouth daily as needed (inattention ADD).    Dispense:  30 capsule    Refill:  0    First fill 08/03/23   amphetamine-dextroamphetamine (ADDERALL XR) 15 MG 24 hr capsule    Sig: Take 1 capsule by mouth daily as needed (inattention ADD).    Dispense:  30 capsule    Refill:  0    First fill 07/06/23   amphetamine-dextroamphetamine (ADDERALL XR) 15 MG 24 hr capsule    Sig: Take 1 capsule by mouth daily as needed (inattention ADD).    Dispense:  30 capsule    Refill:  0    First fill 09/01/23      Follow up plan: Return in about 4 months (around 11/01/2023) for 4-5  month fasting lab only then 1 week later Annual Physical AM apt preferred.  Future labs ordered for 11/11/23   Saralyn Pilar, DO [facility] 07/02/2023, 8:51 AM

## 2023-07-08 ENCOUNTER — Other Ambulatory Visit: Payer: Self-pay | Admitting: Family Medicine

## 2023-07-08 DIAGNOSIS — G43909 Migraine, unspecified, not intractable, without status migrainosus: Secondary | ICD-10-CM

## 2023-07-09 NOTE — Telephone Encounter (Signed)
Requested medication (s) are due for refill today: Yes  Requested medication (s) are on the active medication list: Yes  Last refill:  04/02/23  Future visit scheduled: Yes  Notes to clinic:  Unable to refill per protocol, medication not assigned to the refill protocol.      Requested Prescriptions  Pending Prescriptions Disp Refills   UBRELVY 100 MG TABS [Pharmacy Med Name: UBRELVY 100 MG TABLET] 16 tablet 5    Sig: TAKE 1 TABLET BY MOUTH ONCE AS NEEDED FOR UP TO 1 DOSE FOR MIGRAINE. MAY REPEAT ONE DOSE IN 2 HOURS IF HEADACHE PERSISTS, FOR MAX DOSE 24 HOURS     Off-Protocol Failed - 07/08/2023  8:41 PM      Failed - Medication not assigned to a protocol, review manually.      Passed - Valid encounter within last 12 months    Recent Outpatient Visits           1 week ago Episodic migraine   [place] [facility], Netta Neat, DO   3 months ago Episodic migraine   [place] [facility] Smitty Cords, DO   6 months ago Episodic migraine   [place] [facility] Smitty Cords, DO   1 year ago GAD (generalized anxiety disorder)   [place] [facility] Smitty Cords, DO   2 years ago Major depressive disorder, recurrent, moderate [facility])   [place] [facility] Althea Charon, Netta Neat, DO       Future Appointments             In 4 months Althea Charon, Netta Neat, DO [facility], [facility]

## 2023-07-16 ENCOUNTER — Other Ambulatory Visit: Payer: Self-pay | Admitting: Family Medicine

## 2023-07-16 DIAGNOSIS — F411 Generalized anxiety disorder: Secondary | ICD-10-CM

## 2023-07-16 DIAGNOSIS — F331 Major depressive disorder, recurrent, moderate: Secondary | ICD-10-CM

## 2023-07-17 NOTE — Telephone Encounter (Signed)
Requested Prescriptions  Refused Prescriptions Disp Refills   FLUoxetine (PROZAC) 20 MG capsule [Pharmacy Med Name: FLUOXETINE HCL 20 MG CAPSULE] 270 capsule 1    Sig: TAKE 3 CAPSULES BY MOUTH EVERY DAY     Psychiatry:  Antidepressants - SSRI Passed - 07/16/2023  1:31 AM      Passed - Completed PHQ-2 or PHQ-9 in the last 360 days      Passed - Valid encounter within last 6 months    Recent Outpatient Visits           2 weeks ago Episodic migraine   [place] [facility], Netta Neat, DO   3 months ago Episodic migraine   [place] [facility] Smitty Cords, DO   6 months ago Episodic migraine   [place] [facility] Smitty Cords, DO   1 year ago GAD (generalized anxiety disorder)   [place] [facility] Smitty Cords, DO   2 years ago Major depressive disorder, recurrent, moderate (HCC)   [place] [facility] [employer], Netta Neat, DO       Future Appointments             In 4 months Althea Charon, Netta Neat, DO  [place] [facility], [facility]

## 2023-08-09 ENCOUNTER — Other Ambulatory Visit: Payer: Self-pay | Admitting: Family Medicine

## 2023-08-09 DIAGNOSIS — F331 Major depressive disorder, recurrent, moderate: Secondary | ICD-10-CM

## 2023-08-09 DIAGNOSIS — F411 Generalized anxiety disorder: Secondary | ICD-10-CM

## 2023-08-11 NOTE — Telephone Encounter (Signed)
Requested Prescriptions  Pending Prescriptions Disp Refills   FLUoxetine (PROZAC) 20 MG capsule [Pharmacy Med Name: FLUOXETINE HCL 20 MG CAPSULE] 270 capsule 1    Sig: TAKE 3 CAPSULES BY MOUTH EVERY DAY     Psychiatry:  Antidepressants - SSRI Passed - 08/09/2023  8:41 AM      Passed - Completed PHQ-2 or PHQ-9 in the last 360 days      Passed - Valid encounter within last 6 months    Recent Outpatient Visits           1 month ago Episodic migraine   [place] [facility] Smitty Cords, DO   4 months ago Episodic migraine   [place] [facility] Smitty Cords, DO   7 months ago Episodic migraine   [place] [facility] Smitty Cords, DO   1 year ago GAD (generalized anxiety disorder)   [place] [facility] Smitty Cords, DO   2 years ago Major depressive disorder, recurrent, moderate (HCC)   [place] [facility] [place], Netta Neat, DO       Future Appointments             In 3 months Althea Charon, Netta Neat, DO  [facility], [facility]

## 2023-09-02 ENCOUNTER — Other Ambulatory Visit: Payer: Self-pay | Admitting: Internal Medicine

## 2023-09-02 DIAGNOSIS — G43909 Migraine, unspecified, not intractable, without status migrainosus: Secondary | ICD-10-CM

## 2023-09-03 NOTE — Telephone Encounter (Signed)
Requested medication (s) are due for refill today:   Yes  Requested medication (s) are on the active medication list:   Yes  Future visit scheduled:   Yes 12/17   Last ordered: 07/10/2023 #16, 0 refills  No protocol assigned to this medication    Requested Prescriptions  Pending Prescriptions Disp Refills   UBRELVY 100 MG TABS [Pharmacy Med Name: UBRELVY 100 MG TABLET] 16 tablet 0    Sig: TAKE 1 TABLET BY MOUTH ONCE AS NEEDED FOR UP TO 1 DOSE FOR MIGRAINE. MAY REPEAT ONE DOSE IN 2 HOURS IF HEADACHE PERSISTS, FOR MAX DOSE 24 HOURS     Off-Protocol Failed - 09/02/2023 10:22 AM      Failed - Medication not assigned to a protocol, review manually.      Passed - Valid encounter within last 12 months    Recent Outpatient Visits           2 months ago Episodic migraine   [place] [facility] Smitty Cords, DO   5 months ago Episodic migraine   [place] [facility] Smitty Cords, DO   8 months ago Episodic migraine   [place] [facility] Smitty Cords, DO   1 year ago GAD (generalized anxiety disorder)   [place] [facility] Smitty Cords, DO   2 years ago Major depressive disorder, recurrent, moderate [facility])   [place] [facility] Althea Charon, Netta Neat, DO       Future Appointments             In 2 months Althea Charon, Netta Neat, DO  [facility], [facility]

## 2023-10-20 ENCOUNTER — Other Ambulatory Visit: Payer: Self-pay | Admitting: Family Medicine

## 2023-10-20 DIAGNOSIS — F900 Attention-deficit hyperactivity disorder, predominantly inattentive type: Secondary | ICD-10-CM

## 2023-10-20 MED ORDER — AMPHETAMINE-DEXTROAMPHET ER 15 MG PO CP24
15.0000 mg | ORAL_CAPSULE | Freq: Every day | ORAL | 0 refills | Status: DC | PRN
Start: 2023-11-17 — End: 2023-12-21

## 2023-10-20 MED ORDER — AMPHETAMINE-DEXTROAMPHET ER 15 MG PO CP24
15.0000 mg | ORAL_CAPSULE | Freq: Every day | ORAL | 0 refills | Status: DC | PRN
Start: 2023-12-15 — End: 2023-12-21

## 2023-10-20 MED ORDER — AMPHETAMINE-DEXTROAMPHET ER 15 MG PO CP24
15.0000 mg | ORAL_CAPSULE | Freq: Every day | ORAL | 0 refills | Status: DC | PRN
Start: 2023-10-20 — End: 2023-12-21

## 2023-11-11 ENCOUNTER — Other Ambulatory Visit: Payer: BC Managed Care – PPO

## 2023-11-11 DIAGNOSIS — R7309 Other abnormal glucose: Secondary | ICD-10-CM

## 2023-11-11 DIAGNOSIS — F3341 Major depressive disorder, recurrent, in partial remission: Secondary | ICD-10-CM

## 2023-11-11 DIAGNOSIS — Z0000 Encounter for general adult medical examination without abnormal findings: Secondary | ICD-10-CM

## 2023-11-11 DIAGNOSIS — E782 Mixed hyperlipidemia: Secondary | ICD-10-CM

## 2023-11-11 DIAGNOSIS — G43909 Migraine, unspecified, not intractable, without status migrainosus: Secondary | ICD-10-CM

## 2023-11-11 DIAGNOSIS — E559 Vitamin D deficiency, unspecified: Secondary | ICD-10-CM

## 2023-11-17 ENCOUNTER — Encounter: Payer: Self-pay | Admitting: Family Medicine

## 2023-11-17 ENCOUNTER — Ambulatory Visit (INDEPENDENT_AMBULATORY_CARE_PROVIDER_SITE_OTHER): Payer: BC Managed Care – PPO | Admitting: Family Medicine

## 2023-11-17 VITALS — BP 120/82 | HR 88 | Ht 60.0 in | Wt 130.0 lb

## 2023-11-17 DIAGNOSIS — F331 Major depressive disorder, recurrent, moderate: Secondary | ICD-10-CM

## 2023-11-17 DIAGNOSIS — E782 Mixed hyperlipidemia: Secondary | ICD-10-CM

## 2023-11-17 DIAGNOSIS — E559 Vitamin D deficiency, unspecified: Secondary | ICD-10-CM | POA: Diagnosis not present

## 2023-11-17 DIAGNOSIS — Z0000 Encounter for general adult medical examination without abnormal findings: Secondary | ICD-10-CM

## 2023-11-17 DIAGNOSIS — G43909 Migraine, unspecified, not intractable, without status migrainosus: Secondary | ICD-10-CM

## 2023-11-17 DIAGNOSIS — F900 Attention-deficit hyperactivity disorder, predominantly inattentive type: Secondary | ICD-10-CM

## 2023-11-17 DIAGNOSIS — F3341 Major depressive disorder, recurrent, in partial remission: Secondary | ICD-10-CM

## 2023-11-17 DIAGNOSIS — R7309 Other abnormal glucose: Secondary | ICD-10-CM | POA: Diagnosis not present

## 2023-11-17 DIAGNOSIS — F411 Generalized anxiety disorder: Secondary | ICD-10-CM

## 2023-11-17 DIAGNOSIS — Z8249 Family history of ischemic heart disease and other diseases of the circulatory system: Secondary | ICD-10-CM

## 2023-11-17 MED ORDER — FLUOXETINE HCL 20 MG PO CAPS: 60.0000 mg | ORAL_CAPSULE | Freq: Every day | ORAL | 3 refills | Status: DC

## 2023-11-17 NOTE — Assessment & Plan Note (Signed)
Recurrent chronic major depression, in partial remission at this time. Assoc with comorbid anxiety and insomnia Improved on med management - continues on SSRI Fluoxetine high dose, Wellbutrin XL, Buspar Followed by counselor / therapist

## 2023-11-17 NOTE — Progress Notes (Signed)
Subjective:    Patient ID: Cheryl Smith, female    DOB: [birth year], 42 y.o.   MRN: [id number]  Cheryl Smith is a 42 y.o. female presenting on 11/17/2023 for Annual Exam (Yearly follow up )   HPI  Discussed the use of AI scribe software for clinical note transcription with the patient, who gave verbal consent to proceed.  Here for Annual Physical and fasting labs today  Episodic Migrainse Improved overall. Taking Ubrelvy AS NEEDED, she is taking the med prior to onset of Migraine earlier than before and it is successful. She takes it about 1 x per week, or about 4 per month currently. Previously on Rizatriptan.   Major Depression, partial remission / Anxiety ADHD, inattention   Currently following - [facility] Taking Fluoxetine 20mg  x 3 = 60mg  daily Taking Bupropion XL 300mg  daily   Currently on Adderall XR 15mg  daily, she takes mostly on work days during the week, may skip weekends. 2 refills available.     Family history of heart disease The patient has a family history of heart disease, with both grandmothers and a cousin having experienced heart attacks.    S/p Hysterectomy since age 20+, no longer due for pap smears.  Health Maintenance: Mammogram ordered 07/2023 (initial screening),. Not scheduled yet. She will work on it.     11/17/2023    9:13 AM 07/02/2023    8:45 AM 04/02/2023    8:19 AM  Depression screen PHQ 2/9  Decreased Interest 1 0 0  Down, Depressed, Hopeless 1 0 0  PHQ - 2 Score 2 0 0  Altered sleeping 2 1 0  Tired, decreased energy 2 1 2   Change in appetite 0 0 0  Feeling bad or failure about yourself  1 0 0  Trouble concentrating 1 0 0  Moving slowly or fidgety/restless 0 0 0  Suicidal thoughts 0 0 0  PHQ-9 Score 8 2 2   Difficult doing work/chores Not difficult at all Not difficult at all        11/17/2023    9:14 AM 07/02/2023    8:45 AM 04/02/2023    8:19 AM 12/23/2022    3:39 PM  GAD 7 : Generalized Anxiety Score   Nervous, Anxious, on Edge 0 0 0 1  Control/stop worrying 0 0 1 0  Worry too much - different things 0 0 1 0  Trouble relaxing 0 0 0 1  Restless 0 0 1 1  Easily annoyed or irritable 0 0 0 1  Afraid - awful might happen 0 0 0 0  Total GAD 7 Score 0 0 3 4  Anxiety Difficulty  Not difficult at all  Somewhat difficult     Past Medical History:  Diagnosis Date   Frequent headaches    Headache    2x week tension, monthly migraine   Substance abuse (HCC)    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   Past Surgical History:  Procedure Laterality Date   VAGINAL HYSTERECTOMY Left 2012   Rt ovary remains   Social History   Socioeconomic History   Marital status: Significant Other    Spouse name: Not on file   Number of children: 1   Years of education: Not on file   Highest education level: Associate degree: occupational, Scientist, product/process development, or vocational program  Occupational History   Not on file  Tobacco Use   Smoking status: Former    Current packs/day: 0.00    Average packs/day: 0.3 packs/day for 5.0 years (  1.3 ttl pk-yrs)    Types: Cigarettes    Start date: 12/01/2013    Quit date: 12/01/2018    Years since quitting: 4.9   Smokeless tobacco: Never  Vaping Use   Vaping status: Never Used  Substance and Sexual Activity   Alcohol use: Not Currently   Drug use: Not Currently   Sexual activity: Yes    Birth control/protection: Surgical  Other Topics Concern   Not on file  Social History Narrative   Not on file   Social Drivers of Health   Financial Resource Strain: Low Risk  (11/17/2023)   Overall Financial Resource Strain (CARDIA)    Difficulty of Paying Living Expenses: Not hard at all  Food Insecurity: No Food Insecurity (11/17/2023)   Hunger Vital Sign    Worried About Running Out of Food in the Last Year: Never true    Ran Out of Food in the Last Year: Never true  Transportation Needs: No Transportation Needs (11/17/2023)   PRAPARE - Administrator, Civil Service  (Medical): No    Lack of Transportation (Non-Medical): No  Physical Activity: Insufficiently Active (11/17/2023)   Exercise Vital Sign    Days of Exercise per Week: 7 days    Minutes of Exercise per Session: 20 min  Stress: No Stress Concern Present (11/17/2023)   [employer] of Occupational Health - Occupational Stress Questionnaire    Feeling of Stress : Only a little  Social Connections: Moderately Integrated (11/17/2023)   Social Connection and Isolation Panel [NHANES]    Frequency of Communication with Friends and Family: More than three times a week    Frequency of Social Gatherings with Friends and Family: Once a week    Attends Religious Services: 1 to 4 times per year    Active Member of [employer] or Organizations: No    Attends Banker Meetings: Never    Marital Status: Married  Catering manager Violence: Not At Risk (11/17/2023)   Humiliation, Afraid, Rape, and Kick questionnaire    Fear of Current or Ex-Partner: No    Emotionally Abused: No    Physically Abused: No    Sexually Abused: No   Family History  Problem Relation Age of Onset   Depression Mother    Kidney cancer Maternal Grandmother    Heart disease Maternal Grandmother    Diabetes Maternal Grandfather    Heart disease Paternal Grandmother    Stroke Paternal Grandmother    Heart disease Maternal Aunt    Breast cancer Maternal Aunt    Heart disease Cousin    Breast cancer Cousin 40   Current Outpatient Medications on File Prior to Visit  Medication Sig   [START ON 12/15/2023] amphetamine-dextroamphetamine (ADDERALL XR) 15 MG 24 hr capsule Take 1 capsule by mouth daily as needed (inattention ADD).   amphetamine-dextroamphetamine (ADDERALL XR) 15 MG 24 hr capsule Take 1 capsule by mouth daily as needed (inattention ADD).   amphetamine-dextroamphetamine (ADDERALL XR) 15 MG 24 hr capsule Take 1 capsule by mouth daily as needed (inattention ADD).   buPROPion (WELLBUTRIN XL) 300 MG 24 hr tablet  TAKE 1 TABLET BY MOUTH EVERY DAY   OVER THE COUNTER MEDICATION Herbal supplement   UBRELVY 100 MG TABS TAKE 1 TABLET BY MOUTH ONCE AS NEEDED FOR UP TO 1 DOSE FOR MIGRAINE. MAY REPEAT ONE DOSE IN 2 HOURS IF HEADACHE PERSISTS, FOR MAX DOSE 24 HOURS   ondansetron (ZOFRAN-ODT) 4 MG disintegrating tablet Take 1 tablet (4 mg total) by  mouth every 8 (eight) hours as needed for nausea or vomiting. (Patient not taking: Reported on 11/17/2023)   No current facility-administered medications on file prior to visit.    Review of Systems  Constitutional:  Negative for activity change, appetite change, chills, diaphoresis, fatigue and fever.  HENT:  Negative for congestion and hearing loss.   Eyes:  Negative for visual disturbance.  Respiratory:  Negative for cough, chest tightness, shortness of breath and wheezing.   Cardiovascular:  Negative for chest pain, palpitations and leg swelling.  Gastrointestinal:  Negative for abdominal pain, constipation, diarrhea, nausea and vomiting.  Genitourinary:  Negative for dysuria, frequency and hematuria.  Musculoskeletal:  Negative for arthralgias and neck pain.  Skin:  Negative for rash.  Neurological:  Negative for dizziness, weakness, light-headedness, numbness and headaches.  Hematological:  Negative for adenopathy.  Psychiatric/Behavioral:  Negative for behavioral problems, dysphoric mood and sleep disturbance.    Per HPI unless specifically indicated above     Objective:    BP 120/82   Pulse 88   Ht 5' (1.524 m)   Wt 130 lb (59 kg)   BMI 25.39 kg/m   Wt Readings from Last 3 Encounters:  11/17/23 130 lb (59 kg)  07/02/23 133 lb (60.3 kg)  04/02/23 137 lb (62.1 kg)    Physical Exam Vitals and nursing note reviewed.  Constitutional:      General: She is not in acute distress.    Appearance: She is well-developed. She is not diaphoretic.     Comments: Well-appearing, comfortable, cooperative  HENT:     Head: Normocephalic and atraumatic.   Eyes:     General:        Right eye: No discharge.        Left eye: No discharge.     Conjunctiva/sclera: Conjunctivae normal.     Pupils: Pupils are equal, round, and reactive to light.  Neck:     Thyroid: No thyromegaly.     Vascular: No carotid bruit.  Cardiovascular:     Rate and Rhythm: Normal rate and regular rhythm.     Pulses: Normal pulses.     Heart sounds: Normal heart sounds. No murmur heard. Pulmonary:     Effort: Pulmonary effort is normal. No respiratory distress.     Breath sounds: Normal breath sounds. No wheezing or rales.  Abdominal:     General: Bowel sounds are normal. There is no distension.     Palpations: Abdomen is soft. There is no mass.     Tenderness: There is no abdominal tenderness.  Musculoskeletal:        General: No tenderness. Normal range of motion.     Cervical back: Normal range of motion and neck supple.     Right lower leg: No edema.     Left lower leg: No edema.     Comments: Upper / Lower Extremities: - Normal muscle tone, strength bilateral upper extremities 5/5, lower extremities 5/5  Lymphadenopathy:     Cervical: No cervical adenopathy.  Skin:    General: Skin is warm and dry.     Findings: No erythema or rash.  Neurological:     Mental Status: She is alert and oriented to person, place, and time.     Comments: Distal sensation intact to light touch all extremities  Psychiatric:        Mood and Affect: Mood normal.        Behavior: Behavior normal.        Thought Content:  Thought content normal.     Comments: Well groomed, good eye contact, normal speech and thoughts     Results for orders placed or performed during the hospital encounter of 04/12/21  Basic metabolic panel   Collection Time: 04/12/21 10:09 PM  Result Value Ref Range   Sodium 139 135 - 145 mmol/L   Potassium 3.6 3.5 - 5.1 mmol/L   Chloride 101 98 - 111 mmol/L   CO2 29 22 - 32 mmol/L   Glucose, Bld 86 70 - 99 mg/dL   BUN 15 6 - 20 mg/dL   Creatinine,  Ser 4.69 (H) 0.44 - 1.00 mg/dL   Calcium 9.9 8.9 - 62.9 mg/dL   GFR, Estimated >52 >84 mL/min   Anion gap 9 5 - 15  CBC   Collection Time: 04/12/21 10:09 PM  Result Value Ref Range   WBC 8.5 4.0 - 10.5 K/uL   RBC 4.52 3.87 - 5.11 MIL/uL   Hemoglobin 13.6 12.0 - 15.0 g/dL   HCT 13.2 44.0 - 10.2 %   MCV 90.0 80.0 - 100.0 fL   MCH 30.1 26.0 - 34.0 pg   MCHC 33.4 30.0 - 36.0 g/dL   RDW 72.5 36.6 - 44.0 %   Platelets 407 (H) 150 - 400 K/uL   nRBC 0.0 0.0 - 0.2 %  Troponin I (High Sensitivity)   Collection Time: 04/12/21 10:09 PM  Result Value Ref Range   Troponin I (High Sensitivity) <2 <18 ng/L  Hepatic function panel   Collection Time: 04/12/21 10:12 PM  Result Value Ref Range   Total Protein 7.4 6.5 - 8.1 g/dL   Albumin 4.3 3.5 - 5.0 g/dL   AST 18 15 - 41 U/L   ALT 17 0 - 44 U/L   Alkaline Phosphatase 79 38 - 126 U/L   Total Bilirubin 0.4 0.3 - 1.2 mg/dL   Bilirubin, Direct <3.4 0.0 - 0.2 mg/dL   Indirect Bilirubin NOT CALCULATED 0.3 - 0.9 mg/dL  Lipase, blood   Collection Time: 04/12/21 10:12 PM  Result Value Ref Range   Lipase 32 11 - 51 U/L  Troponin I (High Sensitivity)   Collection Time: 04/12/21 10:12 PM  Result Value Ref Range   Troponin I (High Sensitivity) 2 <18 ng/L  Urinalysis, Complete w Microscopic Urine, Clean Catch   Collection Time: 04/12/21 10:14 PM  Result Value Ref Range   Color, Urine COLORLESS (A) YELLOW   APPearance CLEAR (A) CLEAR   Specific Gravity, Urine 1.002 (L) 1.005 - 1.030   pH 8.0 5.0 - 8.0   Glucose, UA NEGATIVE NEGATIVE mg/dL   Hgb urine dipstick NEGATIVE NEGATIVE   Bilirubin Urine NEGATIVE NEGATIVE   Ketones, ur NEGATIVE NEGATIVE mg/dL   Protein, ur NEGATIVE NEGATIVE mg/dL   Nitrite NEGATIVE NEGATIVE   Leukocytes,Ua NEGATIVE NEGATIVE   WBC, UA NONE SEEN 0 - 5 WBC/hpf   Bacteria, UA NONE SEEN NONE SEEN   Squamous Epithelial / HPF 0-5 0 - 5  POC urine preg, ED   Collection Time: 04/12/21 10:41 PM  Result Value Ref Range   Preg  Test, Ur NEGATIVE NEGATIVE      Assessment & Plan:   Problem List Items Addressed This Visit     Attention deficit hyperactivity disorder (ADHD), predominantly inattentive type   Controlled ADD inattentive On Adderall XR 15mg  daily Taking most days for work, skip weekends  Continue Adderall XR 15mg  dosing  Upcoming fill dates Dec 2024 / Jan 2025  Episodic migraine   Relevant Medications   FLUoxetine (PROZAC) 20 MG capsule   GAD (generalized anxiety disorder)   Improved control On medication See A&P      Relevant Medications   FLUoxetine (PROZAC) 20 MG capsule   Major depressive disorder, recurrent episode, in partial remission (HCC)   Recurrent chronic major depression, in partial remission at this time. Assoc with comorbid anxiety and insomnia Improved on med management - continues on SSRI Fluoxetine high dose, Wellbutrin XL, Buspar Followed by counselor / therapist      Relevant Medications   FLUoxetine (PROZAC) 20 MG capsule   Other Visit Diagnoses       Annual physical exam    -  Primary     Mixed hyperlipidemia         Major depressive disorder, recurrent, moderate (HCC)       Relevant Medications   FLUoxetine (PROZAC) 20 MG capsule     Family history of heart disease       Relevant Orders   CT CARDIAC SCORING (SELF PAY ONLY)        Updated Health Maintenance information Reviewed recent lab results with patient Encouraged improvement to lifestyle with diet and exercise Goal of weight loss   Annual Health Maintenance Routine annual check-up. No new complaints or concerns. -Complete blood work today. -Schedule mammogram. -Order CT scan for Coronary Calcium Score for heart disease screening due to family history  Medication Management Current medications include Adderall, Wellbutrin, Fluoxetine, and Ubrelvy. Patient reports medications are effective and well-tolerated. -Continue current medications. -Refill Fluoxetine. -Check with pharmacy for  remaining Adderall refills.  Episodic Migraine  Improved control Migraines occur approximately once a week. Patient reports effective management with early use of Ubrelvy 100mg  AS NEEDED abortive therapy -Continue current management strategy with Ubrelvy.  Colon Cancer Screening Discussed upcoming colon cancer screening at age 40. -Plan to order Cologuard home test next year.  Follow-up in 1 year for next annual check-up.         Orders Placed This Encounter  Procedures   CT CARDIAC SCORING (SELF PAY ONLY)    Standing Status:   Future    Expiration Date:   11/16/2024    Is patient pregnant?:   No    Preferred imaging location?:   [facility]    Meds ordered this encounter  Medications   FLUoxetine (PROZAC) 20 MG capsule    Sig: Take 3 capsules (60 mg total) by mouth daily.    Dispense:  270 capsule    Refill:  3     Follow up plan: Return for 1 year fasting lab > 1 week later Annual Physical.  Saralyn Pilar, DO [facility] 11/17/2023, 9:31 AM

## 2023-11-17 NOTE — Assessment & Plan Note (Signed)
Improved control On medication See A&P

## 2023-11-17 NOTE — Assessment & Plan Note (Signed)
Controlled ADD inattentive On Adderall XR 15mg  daily Taking most days for work, skip weekends  Continue Adderall XR 15mg  dosing  Upcoming fill dates Dec 2024 / Jan 2025

## 2023-11-17 NOTE — Patient Instructions (Addendum)
Thank you for coming to the office today  Keep on the current medications  Refills sent to pharmacy  For the Adderall 2 refills remaining, check with pharmacy. If run out, let me know.   For Mammogram screening for breast cancer   Call the Imaging Center below anytime to schedule your own appointment now that order has been placed.  [facility] [address] [place], [place] [address] Phone: [phone]    Colon Cancer Screening: - For all adults age 37+ routine colon cancer screening is highly recommended.     - Recent guidelines from American Cancer Society recommend starting age of 75 - Early detection of colon cancer is important, because often there are no warning signs or symptoms, also if found early usually it can be cured. Late stage is hard to treat.  - If you are not interested in Colonoscopy screening (if done and normal you could be cleared for 5 to 10 years until next due), then Cologuard is an excellent alternative for screening test for Colon Cancer. It is highly sensitive for detecting DNA of colon cancer from even the earliest stages. Also, there is NO bowel prep required. - If Cologuard is NEGATIVE, then it is good for 3 years before next due - If Cologuard is POSITIVE, then it is strongly advised to get a Colonoscopy, which allows the GI doctor to locate the source of the cancer or polyp (even very early stage) and treat it by removing it. ------------------------- If you would like to proceed with Cologuard (stool DNA test) - FIRST, call your insurance company and tell them you want to check cost of Cologuard tell them CPT Code 30865 (it may be completely covered and you could get for no cost, OR max cost without any coverage is about $600). Also, keep in mind if you do NOT open the kit, and decide not to do the test, you will NOT be charged, you should contact the company if you decide not  to do the test. - If you want to proceed, you can notify us (phone message, MyChart Message, or at next visit) and we will order it for you. The test kit will be delivered to you house within about 1 week. Follow instructions to collect sample, you may call the company for any help or questions, 24/7 telephone support at [phone].   You have been referred for a Coronary Calcium Score Cardiac CT Scan. This is a screening test for patients aged 82-50+ with cardiovascular risk factors or who are healthy but would be interested in Cardiovascular Screening for heart disease. Even if there is a family history of heart disease, this imaging can be useful. Typically it can be done every 5+ years or at a different timeline we agree on  The scan will look at the chest and mainly focus on the heart and identify early signs of calcium build up or blockages within the heart arteries. It is not 100% accurate for identifying blockages or heart disease, but it is useful to help [place] predict who may have some early changes or be at risk in the future for a heart attack or cardiovascular problem.  The results are reviewed by a Cardiologist and they will document the results. It should become available on MyChart. Typically the results are divided into percentiles based on other patients of the same demographic and age. So it will compare your risk to others similar to  you. If you have a higher score >99 or higher percentile >75%tile, it is recommended to consider Statin cholesterol therapy and or referral to Cardiologist. I will try to help explain your results and if we have questions we can contact the Cardiologist.  You will be contacted for scheduling. Usually it is done at any imaging facility through [facility], [facility] or [facility].  The cost is $99 flat fee total and it does not go through insurance, so no authorization is required.    Please schedule a Follow-up  Appointment to: Return for 1 year fasting lab > 1 week later Annual Physical.  If you have any other questions or concerns, please feel free to call the office or send a message through MyChart. You may also schedule an earlier appointment if necessary.  Additionally, you may be receiving a survey about your experience at our office within a few days to 1 week by e-mail or mail. We value your feedback.  Saralyn Pilar, DO [facility], [place]

## 2023-11-18 LAB — TSH: TSH: 2.01 m[IU]/L

## 2023-11-18 LAB — HEMOGLOBIN A1C
Hgb A1c MFr Bld: 5.6 %{Hb} (ref <5.7)
Mean Plasma Glucose: 114 mg/dL
eAG (mmol/L): 6.3 mmol/L

## 2023-11-18 LAB — LIPID PANEL
Cholesterol: 302 mg/dL — ABNORMAL HIGH (ref <200)
HDL: 103 mg/dL (ref >=50)
LDL Cholesterol (Calc): 181 mg/dL — ABNORMAL HIGH
Non-HDL Cholesterol (Calc): 199 mg/dL — ABNORMAL HIGH (ref <130)
Total CHOL/HDL Ratio: 2.9 (calc) (ref <5.0)
Triglycerides: 78 mg/dL (ref <150)

## 2023-11-18 LAB — CBC WITH DIFFERENTIAL/PLATELET
Absolute Lymphocytes: 1579 {cells}/uL (ref 850–3900)
Absolute Monocytes: 362 {cells}/uL (ref 200–950)
Basophils Absolute: 42 {cells}/uL (ref 0–200)
Basophils Relative: 0.9 %
Eosinophils Absolute: 71 {cells}/uL (ref 15–500)
Eosinophils Relative: 1.5 %
HCT: 40.1 % (ref 35.0–45.0)
Hemoglobin: 12.9 g/dL (ref 11.7–15.5)
MCH: 29.3 pg (ref 27.0–33.0)
MCHC: 32.2 g/dL (ref 32.0–36.0)
MCV: 91.1 fL (ref 80.0–100.0)
MPV: 9.9 fL (ref 7.5–12.5)
Monocytes Relative: 7.7 %
Neutro Abs: 2646 {cells}/uL (ref 1500–7800)
Neutrophils Relative %: 56.3 %
Platelets: 430 10*3/uL — ABNORMAL HIGH (ref 140–400)
RBC: 4.4 10*6/uL (ref 3.80–5.10)
RDW: 11.8 % (ref 11.0–15.0)
Total Lymphocyte: 33.6 %
WBC: 4.7 10*3/uL (ref 3.8–10.8)

## 2023-11-18 LAB — COMPLETE METABOLIC PANEL WITH GFR
AG Ratio: 1.6 (calc) (ref 1.0–2.5)
ALT: 16 U/L (ref 6–29)
AST: 16 U/L (ref 10–30)
Albumin: 4.4 g/dL (ref 3.6–5.1)
Alkaline phosphatase (APISO): 85 U/L (ref 31–125)
BUN: 14 mg/dL (ref 7–25)
CO2: 29 mmol/L (ref 20–32)
Calcium: 10 mg/dL (ref 8.6–10.2)
Chloride: 102 mmol/L (ref 98–110)
Creat: 0.94 mg/dL (ref 0.50–0.99)
Globulin: 2.8 g/dL (ref 1.9–3.7)
Glucose, Bld: 91 mg/dL (ref 65–99)
Potassium: 4.9 mmol/L (ref 3.5–5.3)
Sodium: 140 mmol/L (ref 135–146)
Total Bilirubin: 0.4 mg/dL (ref 0.2–1.2)
Total Protein: 7.2 g/dL (ref 6.1–8.1)
eGFR: 77 mL/min/{1.73_m2} (ref >=60)

## 2023-11-18 LAB — VITAMIN D 25 HYDROXY (VIT D DEFICIENCY, FRACTURES): Vit D, 25-Hydroxy: 15 ng/mL — ABNORMAL LOW (ref 30–100)

## 2023-12-21 ENCOUNTER — Other Ambulatory Visit: Payer: Self-pay | Admitting: Family Medicine

## 2023-12-21 DIAGNOSIS — F900 Attention-deficit hyperactivity disorder, predominantly inattentive type: Secondary | ICD-10-CM

## 2023-12-21 MED ORDER — AMPHETAMINE-DEXTROAMPHET ER 15 MG PO CP24: 15.0000 mg | ORAL_CAPSULE | Freq: Every day | ORAL | 0 refills | Status: DC | PRN

## 2024-01-07 DIAGNOSIS — L089 Local infection of the skin and subcutaneous tissue, unspecified: Secondary | ICD-10-CM | POA: Diagnosis not present

## 2024-01-07 DIAGNOSIS — H00024 Hordeolum internum left upper eyelid: Secondary | ICD-10-CM | POA: Diagnosis not present

## 2024-01-14 DIAGNOSIS — G43809 Other migraine, not intractable, without status migrainosus: Secondary | ICD-10-CM | POA: Diagnosis not present

## 2024-01-15 ENCOUNTER — Other Ambulatory Visit: Payer: Self-pay | Admitting: Family Medicine

## 2024-01-15 DIAGNOSIS — F331 Major depressive disorder, recurrent, moderate: Secondary | ICD-10-CM

## 2024-01-15 NOTE — Telephone Encounter (Signed)
Requested Prescriptions  Pending Prescriptions Disp Refills   buPROPion (WELLBUTRIN XL) 300 MG 24 hr tablet [Pharmacy Med Name: BUPROPION HCL XL 300 MG TABLET] 90 tablet 1    Sig: TAKE 1 TABLET BY MOUTH EVERY DAY     Psychiatry: Antidepressants - bupropion Passed - 01/15/2024  3:35 PM      Passed - Cr in normal range and within 360 days    Creat  Date Value Ref Range Status  11/17/2023 0.94 0.50 - 0.99 mg/dL Final         Passed - AST in normal range and within 360 days    AST  Date Value Ref Range Status  11/17/2023 16 10 - 30 U/L Final         Passed - ALT in normal range and within 360 days    ALT  Date Value Ref Range Status  11/17/2023 16 6 - 29 U/L Final         Passed - Completed PHQ-2 or PHQ-9 in the last 360 days      Passed - Last BP in normal range    BP Readings from Last 1 Encounters:  11/17/23 120/82         Passed - Valid encounter within last 6 months    Recent Outpatient Visits           1 month ago Annual physical exam   [facility] Smitty Cords, DO   6 months ago Episodic migraine   [place] [facility] Smitty Cords, DO   9 months ago Episodic migraine   [facility] Smitty Cords, DO   1 year ago Episodic migraine   [facility] Smitty Cords, DO   1 year ago GAD (generalized anxiety disorder)   [facility] Althea Charon, Netta Neat, DO       Future Appointments             In 10 months Althea Charon, Netta Neat, DO [facility], [facility]

## 2024-02-09 DIAGNOSIS — F4320 Adjustment disorder, unspecified: Secondary | ICD-10-CM | POA: Diagnosis not present

## 2024-02-22 ENCOUNTER — Other Ambulatory Visit: Payer: Self-pay | Admitting: Family Medicine

## 2024-02-22 DIAGNOSIS — F900 Attention-deficit hyperactivity disorder, predominantly inattentive type: Secondary | ICD-10-CM

## 2024-02-22 MED ORDER — AMPHETAMINE-DEXTROAMPHET ER 15 MG PO CP24: 15.0000 mg | ORAL_CAPSULE | Freq: Every day | ORAL | 0 refills | Status: DC | PRN

## 2024-04-28 ENCOUNTER — Other Ambulatory Visit: Payer: Self-pay | Admitting: Family Medicine

## 2024-04-28 DIAGNOSIS — G43909 Migraine, unspecified, not intractable, without status migrainosus: Secondary | ICD-10-CM

## 2024-04-29 NOTE — Telephone Encounter (Signed)
 Requested medications are due for refill today.  unsure  Requested medications are on the active medications list.  yes  Last refill. 09/03/2023 #16 5 rf  Future visit scheduled.   yes  Notes to clinic.  Refill not delegated.    Requested Prescriptions  Pending Prescriptions Disp Refills   UBRELVY  100 MG TABS [Pharmacy Med Name: UBRELVY  100 MG TABLET] 10 tablet 14    Sig: TAKE 1 TABLET BY MOUTH ONCE AS NEEDED FOR UP TO 1 DOSE FOR MIGRAINE. MAY REPEAT ONE DOSE IN 2 HOURS IF HEADACHE PERSISTS, FOR MAX DOSE 24 HOURS     Off-Protocol Failed - 04/29/2024  5:18 PM      Failed - Medication not assigned to a protocol, review manually.      Failed - Valid encounter within last 12 months    Recent Outpatient Visits   None     Future Appointments             In 7 months Karamalegos, Kayleen Party, DO [facility], [facility]

## 2024-05-30 ENCOUNTER — Encounter: Payer: Self-pay | Admitting: Family Medicine

## 2024-05-30 DIAGNOSIS — F900 Attention-deficit hyperactivity disorder, predominantly inattentive type: Secondary | ICD-10-CM

## 2024-05-30 MED ORDER — AMPHETAMINE-DEXTROAMPHET ER 15 MG PO CP24: 15.0000 mg | ORAL_CAPSULE | Freq: Every day | ORAL | 0 refills | Status: DC | PRN

## 2024-06-20 ENCOUNTER — Other Ambulatory Visit (HOSPITAL_COMMUNITY): Payer: Self-pay

## 2024-06-20 ENCOUNTER — Telehealth: Payer: Self-pay

## 2024-06-20 NOTE — Telephone Encounter (Signed)
 Pharmacy Patient Advocate Encounter   Received notification from Onbase that prior authorization for Ubrelvy  100MG  tablets  is required/requested.   Insurance verification completed.   The patient is insured through [facility] .   Per test claim: PA required; PA submitted to above mentioned insurance via CoverMyMeds Key/confirmation #/EOC [id number] Status is pending

## 2024-06-21 ENCOUNTER — Other Ambulatory Visit (HOSPITAL_COMMUNITY): Payer: Self-pay

## 2024-06-21 NOTE — Telephone Encounter (Signed)
 Pharmacy Patient Advocate Encounter  Received notification from [facility] that Prior Authorization for Ubrelvy  100MG  tablets  has been APPROVED from 06/20/24 to 06/20/25   PA #/Case ID/Reference #: [id number]

## 2024-07-12 ENCOUNTER — Other Ambulatory Visit: Payer: Self-pay | Admitting: Family Medicine

## 2024-07-12 DIAGNOSIS — F331 Major depressive disorder, recurrent, moderate: Secondary | ICD-10-CM

## 2024-07-14 NOTE — Telephone Encounter (Signed)
 Per ov of 11/17/2023 - pt is to return to clinic in 1 year. Requested Prescriptions  Pending Prescriptions Disp Refills   buPROPion (WELLBUTRIN XL) 300 MG 24 hr tablet [Pharmacy Med Name: BUPROPION HCL XL 300 MG TABLET] 90 tablet 1    Sig: TAKE 1 TABLET BY MOUTH EVERY DAY     Psychiatry: Antidepressants - bupropion Failed - 07/14/2024  3:16 PM      Failed - Valid encounter within last 6 months    Recent Outpatient Visits   None     Future Appointments             In 4 months Karamalegos, Marsa PARAS, DO [facility], [facility]            Passed - Cr in normal range and within 360 days    Creat  Date Value Ref Range Status  11/17/2023 0.94 0.50 - 0.99 mg/dL Final         Passed - AST in normal range and within 360 days    AST  Date Value Ref Range Status  11/17/2023 16 10 - 30 U/L Final         Passed - ALT in normal range and within 360 days    ALT  Date Value Ref Range Status  11/17/2023 16 6 - 29 U/L Final         Passed - Completed PHQ-2 or PHQ-9 in the last 360 days      Passed - Last BP in normal range    BP Readings from Last 1 Encounters:  11/17/23 120/82

## 2024-09-29 ENCOUNTER — Encounter: Payer: Self-pay | Admitting: Family Medicine

## 2024-09-29 DIAGNOSIS — F900 Attention-deficit hyperactivity disorder, predominantly inattentive type: Secondary | ICD-10-CM

## 2024-09-29 MED ORDER — AMPHETAMINE-DEXTROAMPHET ER 15 MG PO CP24
15.0000 mg | ORAL_CAPSULE | Freq: Every day | ORAL | 0 refills | Status: DC | PRN
Start: 2024-09-29 — End: 2024-09-29

## 2024-09-29 MED ORDER — AMPHETAMINE-DEXTROAMPHET ER 15 MG PO CP24: 15.0000 mg | ORAL_CAPSULE | Freq: Every day | ORAL | 0 refills | Status: DC | PRN

## 2024-09-29 MED ORDER — AMPHETAMINE-DEXTROAMPHET ER 15 MG PO CP24
15.0000 mg | ORAL_CAPSULE | Freq: Every day | ORAL | 0 refills | Status: DC | PRN
Start: 2024-10-27 — End: 2024-09-29

## 2024-09-29 NOTE — Telephone Encounter (Signed)
 Sent to me in error I believe. Your patient.

## 2024-10-20 ENCOUNTER — Other Ambulatory Visit: Payer: Self-pay | Admitting: Family Medicine

## 2024-10-20 DIAGNOSIS — G43909 Migraine, unspecified, not intractable, without status migrainosus: Secondary | ICD-10-CM

## 2024-10-22 NOTE — Telephone Encounter (Signed)
 Requested medication (s) are due for refill today: yes  Requested medication (s) are on the active medication list: yes  Last refill:  05/02/24  Future visit scheduled: {Yes  Notes to clinic:  Pharmacy comment: Alternative Requested:MEDICATION NOT COVERED.      Requested Prescriptions  Pending Prescriptions Disp Refills   UBRELVY  100 MG TABS [Pharmacy Med Name: UBRELVY  100 MG TABLET] 10 tablet 14    Sig: TAKE 1 TABLET BY MOUTH ONCE AS NEEDED FOR UP TO 1 DOSE FOR MIGRAINE. MAY REPEAT ONE DOSE IN 2 HOURS IF HEADACHE PERSISTS, FOR MAX DOSE 24 HOURS     Off-Protocol Failed - 10/22/2024  8:05 AM      Failed - Medication not assigned to a protocol, review manually.      Failed - Valid encounter within last 12 months    Recent Outpatient Visits   None     Future Appointments             In 1 month Karamalegos, Marsa PARAS, DO [facility], Main [address]

## 2024-11-07 ENCOUNTER — Telehealth: Payer: Self-pay | Admitting: Pharmacy Technician

## 2024-11-07 ENCOUNTER — Other Ambulatory Visit (HOSPITAL_COMMUNITY): Payer: Self-pay

## 2024-11-07 ENCOUNTER — Telehealth: Payer: Self-pay

## 2024-11-07 NOTE — Telephone Encounter (Signed)
 Copied from CRM #[id number]. Topic: Clinical - Medication Prior Auth >> Nov 07, 2024 11:07 AM Alfonso ORN wrote: Reason for RMF:[employer] Insurance called on behalf of pt UBRELVY  100 MG TABS requires PA requesting  Fax :[phone] callback: [phone]

## 2024-11-07 NOTE — Telephone Encounter (Signed)
 PA request has been Received. New Encounter has been or will be created for follow up. For additional info see Pharmacy Prior Auth telephone encounter from 11/07/24.

## 2024-11-07 NOTE — Telephone Encounter (Signed)
 Pharmacy Patient Advocate Encounter   Received notification from Pt Calls Messages that prior authorization for Ubrelvy  100mg  is required/requested.   Insurance verification completed.   The patient is insured through [employer].   Per test claim: PA required; PA submitted to above mentioned insurance via Latent Key/confirmation #/EOC [id number] Status is pending

## 2024-11-10 ENCOUNTER — Other Ambulatory Visit (HOSPITAL_COMMUNITY): Payer: Self-pay

## 2024-11-10 NOTE — Telephone Encounter (Signed)
 Pharmacy Patient Advocate Encounter  Received notification from [employer] that Prior Authorization for Ubrelvy  100mg  has been APPROVED from 11/07/24 to 11/07/25. Ran test claim, Copay is $0. This test claim was processed through [facility]- copay amounts may vary at other pharmacies due to pharmacy/plan contracts, or as the patient moves through the different stages of their insurance plan.   PA #/Case ID/Reference #: [id number]

## 2024-11-17 ENCOUNTER — Other Ambulatory Visit: Payer: Self-pay

## 2024-11-17 DIAGNOSIS — F331 Major depressive disorder, recurrent, moderate: Secondary | ICD-10-CM

## 2024-11-17 DIAGNOSIS — G43909 Migraine, unspecified, not intractable, without status migrainosus: Secondary | ICD-10-CM

## 2024-11-17 DIAGNOSIS — R7309 Other abnormal glucose: Secondary | ICD-10-CM

## 2024-11-17 DIAGNOSIS — Z8249 Family history of ischemic heart disease and other diseases of the circulatory system: Secondary | ICD-10-CM

## 2024-11-17 DIAGNOSIS — E559 Vitamin D deficiency, unspecified: Secondary | ICD-10-CM

## 2024-11-17 DIAGNOSIS — Z0000 Encounter for general adult medical examination without abnormal findings: Secondary | ICD-10-CM

## 2024-11-17 DIAGNOSIS — E782 Mixed hyperlipidemia: Secondary | ICD-10-CM

## 2024-11-18 ENCOUNTER — Other Ambulatory Visit: Payer: Self-pay

## 2024-11-19 LAB — CBC WITH DIFFERENTIAL/PLATELET
Absolute Lymphocytes: 1866 {cells}/uL (ref 850–3900)
Absolute Monocytes: 378 {cells}/uL (ref 200–950)
Basophils Absolute: 31 {cells}/uL (ref 0–200)
Basophils Relative: 0.7 %
Eosinophils Absolute: 62 {cells}/uL (ref 15–500)
Eosinophils Relative: 1.4 %
HCT: 38 % (ref 35.9–46.0)
Hemoglobin: 12.2 g/dL (ref 11.7–15.5)
MCH: 29.1 pg (ref 27.0–33.0)
MCHC: 32.1 g/dL (ref 31.6–35.4)
MCV: 90.7 fL (ref 81.4–101.7)
MPV: 9.8 fL (ref 7.5–12.5)
Monocytes Relative: 8.6 %
Neutro Abs: 2064 {cells}/uL (ref 1500–7800)
Neutrophils Relative %: 46.9 %
Platelets: 370 Thousand/uL (ref 140–400)
RBC: 4.19 Million/uL (ref 3.80–5.10)
RDW: 12.3 % (ref 11.0–15.0)
Total Lymphocyte: 42.4 %
WBC: 4.4 Thousand/uL (ref 3.8–10.8)

## 2024-11-19 LAB — COMPREHENSIVE METABOLIC PANEL WITH GFR
AG Ratio: 1.8 (calc) (ref 1.0–2.5)
ALT: 23 U/L (ref 6–29)
AST: 17 U/L (ref 10–35)
Albumin: 4.3 g/dL (ref 3.6–5.1)
Alkaline phosphatase (APISO): 79 U/L (ref 31–125)
BUN: 11 mg/dL (ref 7–25)
CO2: 29 mmol/L (ref 20–32)
Calcium: 9.4 mg/dL (ref 8.6–10.2)
Chloride: 102 mmol/L (ref 98–110)
Creat: 0.87 mg/dL (ref 0.50–0.99)
Globulin: 2.4 g/dL (ref 1.9–3.7)
Glucose, Bld: 88 mg/dL (ref 65–99)
Potassium: 4.8 mmol/L (ref 3.5–5.3)
Sodium: 138 mmol/L (ref 135–146)
Total Bilirubin: 0.3 mg/dL (ref 0.2–1.2)
Total Protein: 6.7 g/dL (ref 6.1–8.1)
eGFR: 84 mL/min/1.73m2

## 2024-11-19 LAB — LIPID PANEL
Cholesterol: 281 mg/dL — ABNORMAL HIGH
HDL: 101 mg/dL
LDL Cholesterol (Calc): 162 mg/dL — ABNORMAL HIGH
Non-HDL Cholesterol (Calc): 180 mg/dL — ABNORMAL HIGH
Total CHOL/HDL Ratio: 2.8 (calc)
Triglycerides: 80 mg/dL

## 2024-11-19 LAB — HEMOGLOBIN A1C
Hgb A1c MFr Bld: 5.5 %
Mean Plasma Glucose: 111 mg/dL
eAG (mmol/L): 6.2 mmol/L

## 2024-11-19 LAB — VITAMIN D 25 HYDROXY (VIT D DEFICIENCY, FRACTURES): Vit D, 25-Hydroxy: 14 ng/mL — ABNORMAL LOW (ref 30–100)

## 2024-11-19 LAB — TSH: TSH: 2.65 m[IU]/L

## 2024-11-28 ENCOUNTER — Ambulatory Visit (INDEPENDENT_AMBULATORY_CARE_PROVIDER_SITE_OTHER): Payer: Self-pay | Admitting: Family Medicine

## 2024-11-28 ENCOUNTER — Other Ambulatory Visit: Payer: Self-pay | Admitting: Family Medicine

## 2024-11-28 ENCOUNTER — Encounter: Payer: Self-pay | Admitting: Family Medicine

## 2024-11-28 VITALS — BP 122/80 | HR 86 | Ht 60.0 in | Wt 134.0 lb

## 2024-11-28 DIAGNOSIS — F900 Attention-deficit hyperactivity disorder, predominantly inattentive type: Secondary | ICD-10-CM

## 2024-11-28 DIAGNOSIS — E559 Vitamin D deficiency, unspecified: Secondary | ICD-10-CM | POA: Diagnosis not present

## 2024-11-28 DIAGNOSIS — Z8249 Family history of ischemic heart disease and other diseases of the circulatory system: Secondary | ICD-10-CM

## 2024-11-28 DIAGNOSIS — Z0000 Encounter for general adult medical examination without abnormal findings: Secondary | ICD-10-CM | POA: Diagnosis not present

## 2024-11-28 DIAGNOSIS — F411 Generalized anxiety disorder: Secondary | ICD-10-CM | POA: Diagnosis not present

## 2024-11-28 DIAGNOSIS — E782 Mixed hyperlipidemia: Secondary | ICD-10-CM

## 2024-11-28 DIAGNOSIS — Z114 Encounter for screening for human immunodeficiency virus [HIV]: Secondary | ICD-10-CM

## 2024-11-28 DIAGNOSIS — Z1211 Encounter for screening for malignant neoplasm of colon: Secondary | ICD-10-CM

## 2024-11-28 DIAGNOSIS — G43909 Migraine, unspecified, not intractable, without status migrainosus: Secondary | ICD-10-CM | POA: Diagnosis not present

## 2024-11-28 DIAGNOSIS — F3341 Major depressive disorder, recurrent, in partial remission: Secondary | ICD-10-CM

## 2024-11-28 DIAGNOSIS — R7309 Other abnormal glucose: Secondary | ICD-10-CM

## 2024-11-28 DIAGNOSIS — Z789 Other specified health status: Secondary | ICD-10-CM

## 2024-11-28 DIAGNOSIS — Z1231 Encounter for screening mammogram for malignant neoplasm of breast: Secondary | ICD-10-CM

## 2024-11-28 DIAGNOSIS — E7800 Pure hypercholesterolemia, unspecified: Secondary | ICD-10-CM

## 2024-11-28 DIAGNOSIS — Z1159 Encounter for screening for other viral diseases: Secondary | ICD-10-CM

## 2024-11-28 MED ORDER — FLUOXETINE HCL 20 MG PO CAPS: 60.0000 mg | ORAL_CAPSULE | Freq: Every day | ORAL | 3 refills | Status: AC

## 2024-11-28 MED ORDER — AMPHETAMINE-DEXTROAMPHET ER 15 MG PO CP24: 15.0000 mg | ORAL_CAPSULE | Freq: Every day | ORAL | 0 refills | Status: AC | PRN

## 2024-11-28 MED ORDER — BUPROPION HCL ER (XL) 300 MG PO TB24: 300.0000 mg | ORAL_TABLET | Freq: Every day | ORAL | 3 refills | Status: AC

## 2024-11-28 NOTE — Progress Notes (Signed)
 "  Subjective:    Patient ID: Cheryl Smith, female    DOB: [birth year], 43 y.o.   MRN: [id number]  Cheryl Smith is a 44 y.o. female presenting on 11/28/2024 for Annual Exam   HPI  Discussed the use of AI scribe software for clinical note transcription with the patient, who gave verbal consent to proceed.  History of Present Illness   Cheryl Smith is a 43 year old female who presents for an annual physical exam.  Cancer screening and family history - No prior mammogram performed despite family encouragement - Family history of breast cancer in a cousin and an aunt - Maternal uncles diagnosed with cancer, including lung cancer and brain tumor - No prior colon cancer screening - No family history of colon cancer  Glycemic control - Hemoglobin A1c is 5.5, improved from 5.6 one year ago  Vitamin d  deficiency - Vitamin D  level is 14, decreased from 15 one year ago - Not currently taking vitamin D  supplementation or multivitamin  Hyperlipidemia - LDL cholesterol is 162, improved from 181 one year ago - Family history of hyperlipidemia in grandmother and father - Not currently on statin therapy - Prefers non-pharmacologic management of cholesterol  Digestive symptoms / Constipation - Ongoing digestive concerns - Uses triple magnesium from [facility] to maintain regularity  Sleep disturbance - Sleep described as unchanged, with occasional difficulty but no major issues  Major Depression, partial remission / Anxiety ADHD, inattention Currently following - [facility] Taking Fluoxetine  20mg  x 3 = 60mg  daily Taking Bupropion  XL 300mg  daily   - Recent refill of bupropion  and fluoxetine  prescriptions - Adderall XR supply typically lasts one month     Episodic Migraines Improved overall. Taking Ubrelvy  AS NEEDED, she is taking the med prior to onset of Migraine earlier than before and it is successful. She takes it about 1 x per week, or about 4 per month  currently. Previously on Rizatriptan .       Family history of heart disease The patient has a family history of heart disease, with both grandmothers and a cousin having experienced heart attacks.    Health Maintenance: Mammogram ordered 10/2024 (initial screening),. Not scheduled yet from order last year, 2024. She will schedule.  Colon CA Screening 1st initial screening through Cologuard. Ordered today  S/p Hysterectomy since age 74+, no longer due for pap smears.      11/28/2024    8:38 AM 11/17/2023    9:13 AM 07/02/2023    8:45 AM  Depression screen PHQ 2/9  Decreased Interest 1 1 0  Down, Depressed, Hopeless 1 1 0  PHQ - 2 Score 2 2 0  Altered sleeping 0 2 1  Tired, decreased energy 1 2 1   Change in appetite 0 0 0  Feeling bad or failure about yourself  0 1 0  Trouble concentrating 1 1 0  Moving slowly or fidgety/restless 0 0 0  Suicidal thoughts 0 0 0  PHQ-9 Score 4 8  2    Difficult doing work/chores Somewhat difficult Not difficult at all Not difficult at all     Data saved with a previous flowsheet row definition       11/28/2024    8:39 AM 11/17/2023    9:14 AM 07/02/2023    8:45 AM 04/02/2023    8:19 AM  GAD 7 : Generalized Anxiety Score  Nervous, Anxious, on Edge 1 0 0 0  Control/stop worrying 1 0 0 1  Worry too much - different things  1 0 0 1  Trouble relaxing 1 0 0 0  Restless 1 0 0 1  Easily annoyed or irritable 1 0 0 0  Afraid - awful might happen 0 0 0 0  Total GAD 7 Score 6 0 0 3  Anxiety Difficulty Somewhat difficult  Not difficult at all      Past Medical History:  Diagnosis Date   Frequent headaches    Headache    2x week tension, monthly migraine   Substance abuse (HCC)    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   Past Surgical History:  Procedure Laterality Date   VAGINAL HYSTERECTOMY Left 2012   Rt ovary remains   Social History   Socioeconomic History   Marital status: Significant Other    Spouse name: Not on file   Number of children: 1    Years of education: Not on file   Highest education level: Associate degree: occupational, scientist, product/process development, or vocational program  Occupational History   Not on file  Tobacco Use   Smoking status: Former    Current packs/day: 0.00    Average packs/day: 0.3 packs/day for 5.0 years (1.3 ttl pk-yrs)    Types: Cigarettes    Start date: 12/01/2013    Quit date: 12/01/2018    Years since quitting: 5.9   Smokeless tobacco: Never  Vaping Use   Vaping status: Never Used  Substance and Sexual Activity   Alcohol use: Not Currently   Drug use: Not Currently   Sexual activity: Yes    Birth control/protection: Surgical  Other Topics Concern   Not on file  Social History Narrative   Not on file   Social Drivers of Health   Tobacco Use: Medium Risk (11/28/2024)   Patient History    Smoking Tobacco Use: Former    Smokeless Tobacco Use: Never    Passive Exposure: Not on Actuary Strain: Low Risk (11/17/2023)   Overall Financial Resource Strain (CARDIA)    Difficulty of Paying Living Expenses: Not hard at all  Food Insecurity: No Food Insecurity (11/17/2023)   Hunger Vital Sign    Worried About Running Out of Food in the Last Year: Never true    Ran Out of Food in the Last Year: Never true  Transportation Needs: No Transportation Needs (11/17/2023)   PRAPARE - Administrator, Civil Service (Medical): No    Lack of Transportation (Non-Medical): No  Physical Activity: Insufficiently Active (11/17/2023)   Exercise Vital Sign    Days of Exercise per Week: 7 days    Minutes of Exercise per Session: 20 min  Stress: No Stress Concern Present (11/17/2023)   [employer] of Occupational Health - Occupational Stress Questionnaire    Feeling of Stress : Only a little  Social Connections: Moderately Integrated (11/17/2023)   Social Connection and Isolation Panel    Frequency of Communication with Friends and Family: More than three times a week    Frequency of Social  Gatherings with Friends and Family: Once a week    Attends Religious Services: 1 to 4 times per year    Active Member of [employer] or Organizations: No    Attends Banker Meetings: Never    Marital Status: Married  Catering Manager Violence: Not At Risk (11/17/2023)   Humiliation, Afraid, Rape, and Kick questionnaire    Fear of Current or Ex-Partner: No    Emotionally Abused: No    Physically Abused: No    Sexually Abused: No  Depression (  PHQ2-9): Low Risk (11/28/2024)   Depression (PHQ2-9)    PHQ-2 Score: 4  Alcohol Screen: Low Risk (07/02/2023)   Alcohol Screen    Last Alcohol Screening Score (AUDIT): 0  Housing: Unknown (11/17/2023)   Housing Stability Vital Sign    Unable to Pay for Housing in the Last Year: No    Number of Times Moved in the Last Year: Not on file    Homeless in the Last Year: No  Utilities: Not At Risk (11/17/2023)   AHC Utilities    Threatened with loss of utilities: No  Health Literacy: Adequate Health Literacy (11/17/2023)   B1300 Health Literacy    Frequency of need for help with medical instructions: Never   Family History  Problem Relation Age of Onset   Depression Mother    Kidney cancer Maternal Grandmother    Heart disease Maternal Grandmother    Diabetes Maternal Grandfather    Heart disease Paternal Grandmother    Stroke Paternal Grandmother    Heart disease Maternal Aunt    Breast cancer Maternal Aunt    Heart disease Cousin    Breast cancer Cousin 40   Current Outpatient Medications on File Prior to Visit  Medication Sig   OVER THE COUNTER MEDICATION Herbal supplement   UBRELVY  100 MG TABS TAKE 1 TABLET BY MOUTH ONCE AS NEEDED FOR UP TO 1 DOSE FOR MIGRAINE. MAY REPEAT ONE DOSE IN 2 HOURS IF HEADACHE PERSISTS, FOR MAX DOSE 24 HOURS   ondansetron  (ZOFRAN -ODT) 4 MG disintegrating tablet Take 1 tablet (4 mg total) by mouth every 8 (eight) hours as needed for nausea or vomiting. (Patient not taking: Reported on 11/28/2024)   No  current facility-administered medications on file prior to visit.    Review of Systems  Constitutional:  Negative for activity change, appetite change, chills, diaphoresis, fatigue and fever.  HENT:  Negative for congestion and hearing loss.   Eyes:  Negative for visual disturbance.  Respiratory:  Negative for cough, chest tightness, shortness of breath and wheezing.   Cardiovascular:  Negative for chest pain, palpitations and leg swelling.  Gastrointestinal:  Negative for abdominal pain, constipation, diarrhea, nausea and vomiting.  Genitourinary:  Negative for dysuria, frequency and hematuria.  Musculoskeletal:  Negative for arthralgias and neck pain.  Skin:  Negative for rash.  Neurological:  Negative for dizziness, weakness, light-headedness, numbness and headaches.  Hematological:  Negative for adenopathy.  Psychiatric/Behavioral:  Negative for behavioral problems, dysphoric mood and sleep disturbance.    Per HPI unless specifically indicated above     Objective:    BP 122/80 (BP Location: Right Arm, Patient Position: Sitting, Cuff Size: Normal)   Pulse 86   Ht 5' (1.524 m)   Wt 134 lb (60.8 kg)   SpO2 96%   BMI 26.17 kg/m   Wt Readings from Last 3 Encounters:  11/28/24 134 lb (60.8 kg)  11/17/23 130 lb (59 kg)  07/02/23 133 lb (60.3 kg)    Physical Exam Vitals and nursing note reviewed.  Constitutional:      General: She is not in acute distress.    Appearance: She is well-developed. She is not diaphoretic.     Comments: Well-appearing, comfortable, cooperative  HENT:     Head: Normocephalic and atraumatic.     Right Ear: Tympanic membrane, ear canal and external ear normal. There is no impacted cerumen.     Left Ear: Tympanic membrane, ear canal and external ear normal. There is no impacted cerumen.  Eyes:  General:        Right eye: No discharge.        Left eye: No discharge.     Conjunctiva/sclera: Conjunctivae normal.     Pupils: Pupils are equal, round,  and reactive to light.  Neck:     Thyroid: No thyromegaly.     Vascular: No carotid bruit.  Cardiovascular:     Rate and Rhythm: Normal rate and regular rhythm.     Pulses: Normal pulses.     Heart sounds: Normal heart sounds. No murmur heard. Pulmonary:     Effort: Pulmonary effort is normal. No respiratory distress.     Breath sounds: Normal breath sounds. No wheezing or rales.  Abdominal:     General: Bowel sounds are normal. There is no distension.     Palpations: Abdomen is soft. There is no mass.     Tenderness: There is no abdominal tenderness.  Musculoskeletal:        General: No tenderness. Normal range of motion.     Cervical back: Normal range of motion and neck supple.     Right lower leg: No edema.     Left lower leg: No edema.     Comments: Upper / Lower Extremities: - Normal muscle tone, strength bilateral upper extremities 5/5, lower extremities 5/5  Lymphadenopathy:     Cervical: No cervical adenopathy.  Skin:    General: Skin is warm and dry.     Findings: No erythema or rash.  Neurological:     Mental Status: She is alert and oriented to person, place, and time.     Comments: Distal sensation intact to light touch all extremities  Psychiatric:        Mood and Affect: Mood normal.        Behavior: Behavior normal.        Thought Content: Thought content normal.     Comments: Well groomed, good eye contact, normal speech and thoughts     Results for orders placed or performed in visit on 11/17/24  Comprehensive metabolic panel with GFR   Collection Time: 11/18/24  8:03 AM  Result Value Ref Range   Glucose, Bld 88 65 - 99 mg/dL   BUN 11 7 - 25 mg/dL   Creat 9.12 9.49 - 9.00 mg/dL   eGFR 84 > OR = 60 fO/fpw/8.26f7   BUN/Creatinine Ratio SEE NOTE: 6 - 22 (calc)   Sodium 138 135 - 146 mmol/L   Potassium 4.8 3.5 - 5.3 mmol/L   Chloride 102 98 - 110 mmol/L   CO2 29 20 - 32 mmol/L   Calcium 9.4 8.6 - 10.2 mg/dL   Total Protein 6.7 6.1 - 8.1 g/dL    Albumin 4.3 3.6 - 5.1 g/dL   Globulin 2.4 1.9 - 3.7 g/dL (calc)   AG Ratio 1.8 1.0 - 2.5 (calc)   Total Bilirubin 0.3 0.2 - 1.2 mg/dL   Alkaline phosphatase (APISO) 79 31 - 125 U/L   AST 17 10 - 35 U/L   ALT 23 6 - 29 U/L  Hemoglobin A1c   Collection Time: 11/18/24  8:03 AM  Result Value Ref Range   Hgb A1c MFr Bld 5.5 <5.7 %   Mean Plasma Glucose 111 mg/dL   eAG (mmol/L) 6.2 mmol/L  CBC with Differential/Platelet   Collection Time: 11/18/24  8:03 AM  Result Value Ref Range   WBC 4.4 3.8 - 10.8 Thousand/uL   RBC 4.19 3.80 - 5.10 Million/uL   Hemoglobin 12.2 11.7 -  15.5 g/dL   HCT 61.9 64.0 - 53.9 %   MCV 90.7 81.4 - 101.7 fL   MCH 29.1 27.0 - 33.0 pg   MCHC 32.1 31.6 - 35.4 g/dL   RDW 87.6 88.9 - 84.9 %   Platelets 370 140 - 400 Thousand/uL   MPV 9.8 7.5 - 12.5 fL   Neutro Abs 2,064 1,500 - 7,800 cells/uL   Absolute Lymphocytes 1,866 850 - 3,900 cells/uL   Absolute Monocytes 378 200 - 950 cells/uL   Eosinophils Absolute 62 15 - 500 cells/uL   Basophils Absolute 31 0 - 200 cells/uL   Neutrophils Relative % 46.9 %   Total Lymphocyte 42.4 %   Monocytes Relative 8.6 %   Eosinophils Relative 1.4 %   Basophils Relative 0.7 %  Lipid panel   Collection Time: 11/18/24  8:03 AM  Result Value Ref Range   Cholesterol 281 (H) <200 mg/dL   HDL 898 > OR = 50 mg/dL   Triglycerides 80 <849 mg/dL   LDL Cholesterol (Calc) 162 (H) mg/dL (calc)   Total CHOL/HDL Ratio 2.8 <5.0 (calc)   Non-HDL Cholesterol (Calc) 180 (H) <130 mg/dL (calc)  TSH   Collection Time: 11/18/24  8:03 AM  Result Value Ref Range   TSH 2.65 mIU/L  VITAMIN D  25 Hydroxy (Vit-D Deficiency, Fractures)   Collection Time: 11/18/24  8:03 AM  Result Value Ref Range   Vit D, 25-Hydroxy 14 (L) 30 - 100 ng/mL      Assessment & Plan:   Problem List Items Addressed This Visit     Attention deficit hyperactivity disorder (ADHD), predominantly inattentive type   Relevant Medications   amphetamine -dextroamphetamine  (ADDERALL XR) 15 MG 24 hr capsule (Start on 12/05/2024)   amphetamine -dextroamphetamine (ADDERALL XR) 15 MG 24 hr capsule (Start on 01/02/2025)   Episodic migraine   Relevant Medications   buPROPion  (WELLBUTRIN  XL) 300 MG 24 hr tablet   FLUoxetine  (PROZAC ) 20 MG capsule   GAD (generalized anxiety disorder)   Relevant Medications   buPROPion  (WELLBUTRIN  XL) 300 MG 24 hr tablet   FLUoxetine  (PROZAC ) 20 MG capsule   Major depressive disorder, recurrent episode, in partial remission   Relevant Medications   buPROPion  (WELLBUTRIN  XL) 300 MG 24 hr tablet   FLUoxetine  (PROZAC ) 20 MG capsule   Other Visit Diagnoses       Annual physical exam    -  Primary     Mixed hyperlipidemia       Relevant Orders   CT CARDIAC SCORING (SELF PAY ONLY)     Family history of heart disease       Relevant Orders   CT CARDIAC SCORING (SELF PAY ONLY)     Encounter for screening mammogram for malignant neoplasm of breast       Relevant Orders   MM 3D SCREENING MAMMOGRAM BILATERAL BREAST     Screening for colon cancer       Relevant Orders   Cologuard     Vitamin D  deficiency            Updated Health Maintenance information Reviewed recent lab results with patient Encouraged improvement to lifestyle with diet and exercise Goal of weight loss  Emphasized preventive care and screenings due to family history of cancer and heart disease. Explained benefits of Cologuard and heart scan. - Ordered mammogram. Initial screening, she will schedule. There is family history of cancer. - Ordered Cologuard for colon cancer screening. Initial screening. - Ordered heart scan to assess cardiovascular  risk. CT imaging  Mixed hyperlipidemia Likely some familial genetic component. LDL improved, total cholesterol elevated due to high HDL. Prefers non-statin management. Discussed heart scan for cardiovascular risk assessment. - Ordered heart scan to assess cardiovascular risk. - Continue monitoring cholesterol  levels.  Episodic Migraines Controlled on AS NEEDED Ubrelvy .  Vitamin D  deficiency Vitamin D  level remains low. Discussed supplementation benefits. - Recommended vitamin D3 supplementation, 5000 IU daily for 12 weeks, then 1000-2000 IU daily for maintenance.  Major depressive disorder, recurrent in partial remission Anxiety Improved control Ensured prescriptions are current. - Refilled bupropion  prescription. - Refilled fluoxetine  prescription.  Attention-deficit hyperactivity disorder, predominantly inattentive type Satisfied with current Adderall XR regimen. - Refilled Adderall XR prescription.          Orders Placed This Encounter  Procedures   MM 3D SCREENING MAMMOGRAM BILATERAL BREAST    Standing Status:   Future    Expiration Date:   11/28/2025    Reason for Exam (SYMPTOM  OR DIAGNOSIS REQUIRED):   Screening bilateral 3D Mammogram Tomo    Preferred imaging location?:   [facility]   CT CARDIAC SCORING (SELF PAY ONLY)    Standing Status:   Future    Expiration Date:   11/28/2025    Is patient pregnant?:   No    Preferred imaging location?:   [facility]   Cologuard    Meds ordered this encounter  Medications   buPROPion  (WELLBUTRIN  XL) 300 MG 24 hr tablet    Sig: Take 1 tablet (300 mg total) by mouth daily.    Dispense:  90 tablet    Refill:  3    Add future refills   FLUoxetine  (PROZAC ) 20 MG capsule    Sig: Take 3 capsules (60 mg total) by mouth daily.    Dispense:  270 capsule    Refill:  3    Keep future refills on file   amphetamine -dextroamphetamine (ADDERALL XR) 15 MG 24 hr capsule    Sig: Take 1 capsule by mouth daily as needed (inattention ADD).    Dispense:  30 capsule    Refill:  0    First fill 12/05/2024   amphetamine -dextroamphetamine (ADDERALL XR) 15 MG 24 hr capsule    Sig: Take 1 capsule by mouth daily as needed (inattention ADD).    Dispense:  30 capsule    Refill:  0    First fill 01/02/2025     Follow up plan: Return  for 1 year fasting lab > 1 week later Annual Physical.  Future labs added Add future HIV, Hep C, Hep B Titer 10/2025  Marsa Officer, DO [facility] 11/28/2024, 8:37 AM  "

## 2024-11-28 NOTE — Patient Instructions (Addendum)
 Thank you for coming to the office today.  LDL cholesterol has improved to 160s, but still elevated No Statin therapy at this time, we can check heart scan first.  Vitamin D  mild low at 14 Okay to supplement for immune and energy Vitamin D3 OTC 5,000 unit per day for 12 weeks or 3 months, then down to 1000 to 2000 per day for maintenance.  For Mammogram screening for breast cancer   Call the Imaging Center below anytime to schedule your own appointment now that order has been placed.  [facility] [address] [place], [place] [address] Phone: [phone]  Colon Cancer Screening: Ordered the Cologuard (home kit) test for colon cancer screening. Stay tuned for further updates.  It will be shipped to you directly. If not received in 2-4 weeks, call us  or the company.   If you send it back and no results are received in 2-4 weeks, call us  or the company as well!  -----------------   You have been referred for a Coronary Calcium Score Cardiac CT Scan. This is a screening test for patients aged 37-50+ with cardiovascular risk factors or who are healthy but would be interested in Cardiovascular Screening for heart disease. Even if there is a family history of heart disease, this imaging can be useful. Typically it can be done every 5+ years or at a different timeline we agree on  The scan will look at the chest and mainly focus on the heart and identify early signs of calcium build up or blockages within the heart arteries. It is not 100% accurate for identifying blockages or heart disease, but it is useful to help us  predict who may have some early changes or be at risk in the future for a heart attack or cardiovascular problem.  The results are reviewed by a Cardiologist and they will document the results. It should become available on MyChart. Typically the results are divided into percentiles based on  other patients of the same demographic and age. So it will compare your risk to others similar to you. If you have a higher score >99 or higher percentile >75%tile, it is recommended to consider Statin cholesterol therapy and or referral to Cardiologist. I will try to help explain your results and if we have questions we can contact the Cardiologist.  You will be contacted for scheduling. Usually it is done at any imaging facility through [facility], [facility] or [facility].  The cost is $99 flat fee total and it does not go through insurance, so no authorization is required.   Please schedule a Follow-up Appointment to: Return for 1 year fasting lab > 1 week later Annual Physical.  If you have any other questions or concerns, please feel free to call the office or send a message through MyChart. You may also schedule an earlier appointment if necessary.  Additionally, you may be receiving a survey about your experience at our office within a few days to 1 week by e-mail or mail. We value your feedback.  Marsa Officer, DO [facility], [place]

## 2024-12-12 LAB — COLOGUARD: COLOGUARD: POSITIVE — AB

## 2024-12-14 ENCOUNTER — Ambulatory Visit: Payer: Self-pay | Admitting: Family Medicine

## 2024-12-14 DIAGNOSIS — R195 Other fecal abnormalities: Secondary | ICD-10-CM

## 2024-12-15 ENCOUNTER — Other Ambulatory Visit: Payer: Self-pay

## 2024-12-15 ENCOUNTER — Telehealth: Payer: Self-pay

## 2024-12-15 DIAGNOSIS — R195 Other fecal abnormalities: Secondary | ICD-10-CM

## 2024-12-15 DIAGNOSIS — Z1211 Encounter for screening for malignant neoplasm of colon: Secondary | ICD-10-CM

## 2024-12-15 MED ORDER — NA SULFATE-K SULFATE-MG SULF 17.5-3.13-1.6 GM/177ML PO SOLN: 1.0000 | Freq: Once | ORAL | 0 refills | Status: AC

## 2024-12-15 NOTE — Telephone Encounter (Signed)
 Gastroenterology Pre-Procedure Review  Request Date: 01/09/23 Requesting Physician: Dr. theophilus  PATIENT REVIEW QUESTIONS: The patient responded to the following health history questions as indicated:    1. Are you having any GI issues? Positive cologuard 1st colonoscopy no gi issues 2. Do you have a personal history of Polyps? no 3. Do you have a family history of Colon Cancer or Polyps? yes (father colon polyps) 4. Diabetes Mellitus? no 5. Joint replacements in the past 12 months?no 6. Major health problems in the past 3 months?no 7. Any artificial heart valves, MVP, or defibrillator?no    MEDICATIONS & ALLERGIES:    Patient reports the following regarding taking any anticoagulation/antiplatelet therapy:   Plavix, Coumadin, Eliquis, Xarelto, Lovenox, Pradaxa, Brilinta, or Effient? no Aspirin? no  Patient confirms/reports the following medications:  Current Outpatient Medications  Medication Sig Dispense Refill   amphetamine -dextroamphetamine (ADDERALL XR) 15 MG 24 hr capsule Take 1 capsule by mouth daily as needed (inattention ADD). 30 capsule 0   [START ON 01/02/2025] amphetamine -dextroamphetamine (ADDERALL XR) 15 MG 24 hr capsule Take 1 capsule by mouth daily as needed (inattention ADD). 30 capsule 0   buPROPion  (WELLBUTRIN  XL) 300 MG 24 hr tablet Take 1 tablet (300 mg total) by mouth daily. 90 tablet 3   FLUoxetine  (PROZAC ) 20 MG capsule Take 3 capsules (60 mg total) by mouth daily. 270 capsule 3   ondansetron  (ZOFRAN -ODT) 4 MG disintegrating tablet Take 1 tablet (4 mg total) by mouth every 8 (eight) hours as needed for nausea or vomiting. (Patient not taking: Reported on 11/28/2024) 30 tablet 2   OVER THE COUNTER MEDICATION Herbal supplement     UBRELVY  100 MG TABS TAKE 1 TABLET BY MOUTH ONCE AS NEEDED FOR UP TO 1 DOSE FOR MIGRAINE. MAY REPEAT ONE DOSE IN 2 HOURS IF HEADACHE PERSISTS, FOR MAX DOSE 24 HOURS 10 tablet 14   No current facility-administered medications for this  visit.    Patient confirms/reports the following allergies:  Allergies[1]  No orders of the defined types were placed in this encounter.   AUTHORIZATION INFORMATION Primary Insurance: 1D#: Group #:  Secondary Insurance: 1D#: Group #:  SCHEDULE INFORMATION: Date: 01/09/25 Time: Location: [facility]    [1]  Allergies Allergen Reactions   Vistaril [Hydroxyzine Hcl]

## 2025-01-09 ENCOUNTER — Encounter: Admission: RE | Payer: Self-pay | Source: Home / Self Care

## 2025-01-09 ENCOUNTER — Ambulatory Visit: Admission: RE | Admit: 2025-01-09

## 2025-11-20 ENCOUNTER — Other Ambulatory Visit

## 2025-11-27 ENCOUNTER — Encounter: Admitting: Family Medicine
